# Patient Record
Sex: Female | Born: 1955 | Race: White | Hispanic: No | Smoking: Never smoker
Health system: Southern US, Community
[De-identification: ages and names within clinical notes are randomized; demographics above are authoritative.]

## PROBLEM LIST (undated history)

## (undated) DIAGNOSIS — C4491 Basal cell carcinoma of skin, unspecified: Secondary | ICD-10-CM

## (undated) DIAGNOSIS — M1990 Unspecified osteoarthritis, unspecified site: Secondary | ICD-10-CM

## (undated) DIAGNOSIS — T7840XA Allergy, unspecified, initial encounter: Secondary | ICD-10-CM

## (undated) DIAGNOSIS — I2510 Atherosclerotic heart disease of native coronary artery without angina pectoris: Secondary | ICD-10-CM

## (undated) DIAGNOSIS — R569 Unspecified convulsions: Secondary | ICD-10-CM

## (undated) DIAGNOSIS — E669 Obesity, unspecified: Secondary | ICD-10-CM

## (undated) DIAGNOSIS — I10 Essential (primary) hypertension: Secondary | ICD-10-CM

## (undated) DIAGNOSIS — E785 Hyperlipidemia, unspecified: Secondary | ICD-10-CM

## (undated) HISTORY — PX: COLONOSCOPY: SHX174

## (undated) HISTORY — DX: Hyperlipidemia, unspecified: E78.5

## (undated) HISTORY — PX: BREAST EXCISIONAL BIOPSY: SUR124

## (undated) HISTORY — DX: Basal cell carcinoma of skin, unspecified: C44.91

## (undated) HISTORY — PX: TUBAL LIGATION: SHX77

## (undated) HISTORY — PX: TONSILLECTOMY: SUR1361

## (undated) HISTORY — PX: BREAST CYST ASPIRATION: SHX578

## (undated) HISTORY — DX: Allergy, unspecified, initial encounter: T78.40XA

## (undated) HISTORY — DX: Unspecified osteoarthritis, unspecified site: M19.90

---

## 2007-11-23 ENCOUNTER — Ambulatory Visit: Payer: Self-pay | Admitting: Family Medicine

## 2008-04-01 ENCOUNTER — Ambulatory Visit: Payer: Self-pay | Admitting: Internal Medicine

## 2008-04-07 ENCOUNTER — Ambulatory Visit: Payer: Self-pay | Admitting: Family Medicine

## 2008-04-08 ENCOUNTER — Ambulatory Visit: Payer: Self-pay | Admitting: Family Medicine

## 2008-04-11 ENCOUNTER — Ambulatory Visit: Payer: Self-pay | Admitting: Family Medicine

## 2013-02-08 ENCOUNTER — Ambulatory Visit: Payer: Self-pay | Admitting: Unknown Physician Specialty

## 2016-03-31 ENCOUNTER — Encounter: Payer: Self-pay | Admitting: *Deleted

## 2016-04-01 ENCOUNTER — Ambulatory Visit: Payer: Managed Care, Other (non HMO) | Admitting: Anesthesiology

## 2016-04-01 ENCOUNTER — Ambulatory Visit
Admission: RE | Admit: 2016-04-01 | Discharge: 2016-04-01 | Disposition: A | Discharge status: Home / Self Care | Payer: Managed Care, Other (non HMO) | Source: Ambulatory Visit | Attending: Unknown Physician Specialty | Admitting: Unknown Physician Specialty

## 2016-04-01 ENCOUNTER — Encounter
Admission: RE | Disposition: A | Discharge status: Home / Self Care | Payer: Self-pay | Source: Ambulatory Visit | Attending: Unknown Physician Specialty

## 2016-04-01 DIAGNOSIS — Z7951 Long term (current) use of inhaled steroids: Secondary | ICD-10-CM | POA: Insufficient documentation

## 2016-04-01 DIAGNOSIS — Z86010 Personal history of colonic polyps: Secondary | ICD-10-CM | POA: Diagnosis not present

## 2016-04-01 DIAGNOSIS — E669 Obesity, unspecified: Secondary | ICD-10-CM | POA: Diagnosis not present

## 2016-04-01 DIAGNOSIS — Z6832 Body mass index (BMI) 32.0-32.9, adult: Secondary | ICD-10-CM | POA: Diagnosis not present

## 2016-04-01 DIAGNOSIS — I10 Essential (primary) hypertension: Secondary | ICD-10-CM | POA: Diagnosis not present

## 2016-04-01 DIAGNOSIS — Z79899 Other long term (current) drug therapy: Secondary | ICD-10-CM | POA: Insufficient documentation

## 2016-04-01 DIAGNOSIS — D123 Benign neoplasm of transverse colon: Secondary | ICD-10-CM | POA: Diagnosis not present

## 2016-04-01 DIAGNOSIS — Z1211 Encounter for screening for malignant neoplasm of colon: Secondary | ICD-10-CM | POA: Insufficient documentation

## 2016-04-01 HISTORY — DX: Essential (primary) hypertension: I10

## 2016-04-01 HISTORY — DX: Obesity, unspecified: E66.9

## 2016-04-01 HISTORY — PX: COLONOSCOPY WITH PROPOFOL: SHX5780

## 2016-04-01 SURGERY — COLONOSCOPY WITH PROPOFOL
Anesthesia: General

## 2016-04-01 MED ORDER — SODIUM CHLORIDE 0.9 % IV SOLN
INTRAVENOUS | Status: DC
  Administered 2016-04-01: 1000 mL via INTRAVENOUS

## 2016-04-01 MED ORDER — SODIUM CHLORIDE 0.9 % IV SOLN: INTRAVENOUS | Status: DC

## 2016-04-01 MED ORDER — PROPOFOL 500 MG/50ML IV EMUL
INTRAVENOUS | Status: DC | PRN
  Administered 2016-04-01: 140 ug/kg/min via INTRAVENOUS

## 2016-04-01 MED ORDER — FENTANYL CITRATE (PF) 100 MCG/2ML IJ SOLN
INTRAMUSCULAR | Status: DC | PRN
  Administered 2016-04-01: 50 ug via INTRAVENOUS

## 2016-04-01 MED ORDER — PROPOFOL 10 MG/ML IV BOLUS
INTRAVENOUS | Status: DC | PRN
  Administered 2016-04-01: 50 mg via INTRAVENOUS
  Administered 2016-04-01: 20 mg via INTRAVENOUS

## 2016-04-01 MED ORDER — MIDAZOLAM HCL 5 MG/5ML IJ SOLN
INTRAMUSCULAR | Status: DC | PRN
  Administered 2016-04-01: 1 mg via INTRAVENOUS

## 2016-04-01 NOTE — Transfer of Care (Signed)
Immediate Anesthesia Transfer of Care Note  Patient: Heather Dean  Procedure(s) Performed: Procedure(s): COLONOSCOPY WITH PROPOFOL (N/A)  Patient Location: PACU and Endoscopy Unit  Anesthesia Type:General  Level of Consciousness: awake, alert  and oriented  Airway & Oxygen Therapy: Patient Spontanous Breathing and Patient connected to nasal cannula oxygen  Post-op Assessment: Report given to RN and Post -op Vital signs reviewed and stable  Post vital signs: Reviewed and stable  Last Vitals:  Filed Vitals:   04/01/16 1239 04/01/16 1319  BP: 179/80   Pulse: 87   Temp: 36.1 C 36 C  Resp: 16     Last Pain: There were no vitals filed for this visit.       Complications: No apparent anesthesia complications

## 2016-04-01 NOTE — H&P (Signed)
   Primary Care Physician:  PROVIDER NOT [place] Primary Gastroenterologist:  Dr. Vira Agar  Pre-Procedure History & Physical: HPI:  Heather Dean is a 60 y.o. female is here for an colonoscopy.   Past Medical History  Diagnosis Date  . Hypertension   . Obesity     Past Surgical History  Procedure Laterality Date  . Tubal ligation    . Tonsillectomy    . Colonoscopy      Prior to Admission medications   Medication Sig Start Date End Date Taking? Authorizing Provider  Ascorbic Acid (VITAMIN C) 100 MG tablet Take 100 mg by mouth daily.   Yes Historical Provider, MD  calcium citrate-vitamin D (CITRACAL+D) 315-200 MG-UNIT tablet Take 1 tablet by mouth 2 (two) times daily.   Yes Historical Provider, MD  fluticasone (FLONASE) 50 MCG/ACT nasal spray Place into both nostrils daily.   Yes Historical Provider, MD    Allergies as of 03/14/2016  . (Not on File)    History reviewed. No pertinent family history.  Social History   Social History  . Marital Status: Divorced    Spouse Name: N/A  . Number of Children: N/A  . Years of Education: N/A   Occupational History  . Not on file.   Social History Main Topics  . Smoking status: Never Smoker   . Smokeless tobacco: Never Used  . Alcohol Use: Yes  . Drug Use: No  . Sexual Activity: Not on file   Other Topics Concern  . Not on file   Social History Narrative    Review of Systems: See HPI, otherwise negative ROS  Physical Exam: BP 179/80 mmHg  Pulse 87  Temp(Src) 97 F (36.1 C) (Tympanic)  Resp 16  Ht 5\' 3"  (1.6 m)  Wt 82.555 kg (182 lb)  BMI 32.25 kg/m2  SpO2 99% General:   Alert,  pleasant and cooperative in NAD Head:  Normocephalic and atraumatic. Neck:  Supple; no masses or thyromegaly. Lungs:  Clear throughout to auscultation.    Heart:  Regular rate and rhythm. Abdomen:  Soft, nontender and nondistended. Normal bowel sounds, without guarding, and without rebound.   Neurologic:  Alert and  oriented x4;   grossly normal neurologically.  Impression/Plan: Heather Dean is here for an colonoscopy to be performed for [facility] colon polyps  Risks, benefits, limitations, and alternatives regarding  colonoscopy have been reviewed with the patient.  Questions have been answered.  All parties agreeable.   Gaylyn Cheers, MD  04/01/2016, 12:53 PM

## 2016-04-01 NOTE — Anesthesia Preprocedure Evaluation (Signed)
Anesthesia Evaluation  Patient identified by MRN, date of birth, ID band Patient awake    Reviewed: Allergy & Precautions, NPO status , Patient's Chart, lab work & pertinent test results  History of Anesthesia Complications Negative for: history of anesthetic complications  Airway Mallampati: II       Dental   Pulmonary neg pulmonary ROS,           Cardiovascular hypertension,      Neuro/Psych negative neurological ROS     GI/Hepatic negative GI ROS, Neg liver ROS,   Endo/Other  negative endocrine ROS  Renal/GU negative Renal ROS     Musculoskeletal   Abdominal   Peds  Hematology negative hematology ROS (+)   Anesthesia Other Findings   Reproductive/Obstetrics                             Anesthesia Physical Anesthesia Plan  ASA: II  Anesthesia Plan: General   Post-op Pain Management:    Induction: Intravenous  Airway Management Planned: Nasal Cannula  Additional Equipment:   Intra-op Plan:   Post-operative Plan:   Informed Consent: I have reviewed the patients History and Physical, chart, labs and discussed the procedure including the risks, benefits and alternatives for the proposed anesthesia with the patient or authorized representative who has indicated his/her understanding and acceptance.     Plan Discussed with:   Anesthesia Plan Comments:         Anesthesia Quick Evaluation

## 2016-04-01 NOTE — Op Note (Signed)
[facility]astroenterology Patient Name: Heather Dean Procedure Date: 04/01/2016 12:56 PM MRN: [id number] Account #: [id number] Date of Birth: Nov 17, 1955 Admit Type: Outpatient Age: 60 Room: [facility] ENDO ROOM 1 Gender: Female Note Status: Finalized Procedure:            Colonoscopy Indications:          High risk colon cancer surveillance: Personal history                        of colonic polyps Providers:            Manya Silvas, MD Referring MD:         No Local Md, MD (Referring MD) Medicines:            Propofol per Anesthesia Complications:        No immediate complications. Procedure:            Pre-Anesthesia Assessment:                       - After reviewing the risks and benefits, the patient                        was deemed in satisfactory condition to undergo the                        procedure.                       - Using IV propofol under the supervision of a CRNA was                        determined to be medically necessary for this procedure                        based on review of the patient's medical history,                        medications, and prior anesthesia history.                       - After reviewing the risks and benefits, the patient                        was deemed in satisfactory condition to undergo the                        procedure.                       After obtaining informed consent, the colonoscope was                        passed under direct vision. Throughout the procedure,                        the patient's blood pressure, pulse, and oxygen                        saturations were monitored continuously. The  Colonoscope was introduced through the anus and                        advanced to the the cecum, identified by appendiceal                        orifice and ileocecal valve. The colonoscopy was                        performed without difficulty. The patient tolerated the                         procedure well. The quality of the bowel preparation                        was excellent. Findings:      A diminutive polyp was found in the hepatic flexure. The polyp was       sessile. The polyp was removed with a jumbo cold forceps. Resection and       retrieval were complete.      The exam was otherwise without abnormality. Impression:           - One diminutive polyp at the hepatic flexure, removed                        with a jumbo cold forceps. Resected and retrieved.                       - The examination was otherwise normal. Recommendation:       - Await pathology results. Probably repeat in 5 years. Manya Silvas, MD 04/01/2016 1:20:01 PM This report has been signed electronically. Number of Addenda: 0 Note Initiated On: 04/01/2016 12:56 PM Scope Withdrawal Time: 0 hours 9 minutes 26 seconds  Total Procedure Duration: 0 hours 13 minutes 53 seconds       [facility]

## 2016-04-01 NOTE — Anesthesia Postprocedure Evaluation (Signed)
Anesthesia Post Note  Patient: Heather Dean  Procedure(s) Performed: Procedure(s) (LRB): COLONOSCOPY WITH PROPOFOL (N/A)  Patient location during evaluation: Endoscopy Anesthesia Type: General Level of consciousness: awake and alert Pain management: pain level controlled Vital Signs Assessment: post-procedure vital signs reviewed and stable Respiratory status: spontaneous breathing and respiratory function stable Cardiovascular status: stable Anesthetic complications: no    Last Vitals:  Filed Vitals:   04/01/16 1337 04/01/16 1347  BP: 138/79 138/73  Pulse: 73 72  Temp:    Resp: 16 19    Last Pain: There were no vitals filed for this visit.               Lorra Freeman K

## 2016-04-04 ENCOUNTER — Encounter: Payer: Self-pay | Admitting: Unknown Physician Specialty

## 2016-04-04 LAB — SURGICAL PATHOLOGY

## 2021-08-18 ENCOUNTER — Ambulatory Visit (INDEPENDENT_AMBULATORY_CARE_PROVIDER_SITE_OTHER): Payer: Medicare HMO | Admitting: Internal Medicine

## 2021-08-18 ENCOUNTER — Other Ambulatory Visit: Payer: Self-pay

## 2021-08-18 ENCOUNTER — Encounter: Payer: Self-pay | Admitting: Internal Medicine

## 2021-08-18 VITALS — BP 128/86 | HR 93 | Ht 63.0 in | Wt 170.0 lb

## 2021-08-18 DIAGNOSIS — R030 Elevated blood-pressure reading, without diagnosis of hypertension: Secondary | ICD-10-CM | POA: Diagnosis not present

## 2021-08-18 DIAGNOSIS — R058 Other specified cough: Secondary | ICD-10-CM | POA: Insufficient documentation

## 2021-08-18 DIAGNOSIS — Z1231 Encounter for screening mammogram for malignant neoplasm of breast: Secondary | ICD-10-CM | POA: Diagnosis not present

## 2021-08-18 DIAGNOSIS — I10 Essential (primary) hypertension: Secondary | ICD-10-CM | POA: Insufficient documentation

## 2021-08-18 DIAGNOSIS — Z23 Encounter for immunization: Secondary | ICD-10-CM | POA: Diagnosis not present

## 2021-08-18 MED ORDER — FLUTICASONE PROPIONATE 50 MCG/ACT NA SUSP: 2.0000 | Freq: Every day | NASAL | 5 refills | Status: DC

## 2021-08-18 MED ORDER — LORATADINE 10 MG PO TABS
10.0000 mg | ORAL_TABLET | Freq: Every day | ORAL | 1 refills | Status: DC
Start: 2021-08-18 — End: 2022-01-28

## 2021-08-18 NOTE — Progress Notes (Signed)
Date:  08/18/2021   Name:  Heather Dean   DOB:  [birth year]   MRN:  [id number]   Chief Complaint: New Patient (Initial Visit) and Cough  Cough This is a recurrent problem. The current episode started more than 1 month ago. The problem occurs hourly. The cough is Productive of sputum. Associated symptoms include postnasal drip. Pertinent negatives include no chest pain, ear pain, fever, headaches, sore throat or shortness of breath. The symptoms are aggravated by lying down, pollens and cold air. Treatments tried: flonase and claritin intermittently.  Hypertension Chronicity: borderline elevated. The problem has been waxing and waning since onset. Pertinent negatives include no chest pain, headaches, peripheral edema or shortness of breath. Past treatments include lifestyle changes (sodium reduction). There is no history of kidney disease, CAD/MI or CVA.   No results found for: CREATININE, BUN, NA, K, CL, CO2 No results found for: CHOL, HDL, LDLCALC, LDLDIRECT, TRIG, CHOLHDL No results found for: TSH No results found for: HGBA1C No results found for: WBC, HGB, HCT, MCV, PLT No results found for: ALT, AST, GGT, ALKPHOS, BILITOT   Review of Systems  Constitutional:  Negative for fever.  HENT:  Positive for postnasal drip. Negative for ear pain and sore throat.   Respiratory:  Positive for cough. Negative for shortness of breath.   Cardiovascular:  Negative for chest pain.  Neurological:  Negative for headaches.  Psychiatric/Behavioral:  Negative for dysphoric mood and sleep disturbance. The patient is not nervous/anxious.    There are no problems to display for this patient.   No Known Allergies  Past Surgical History:  Procedure Laterality Date   COLONOSCOPY     COLONOSCOPY WITH PROPOFOL N/A 04/01/2016   Procedure: COLONOSCOPY WITH PROPOFOL;  Surgeon: Manya Silvas, MD;  Location: [facility] ENDOSCOPY;  Service: Endoscopy;  Laterality: N/A;   TONSILLECTOMY     TUBAL LIGATION       Social History   Tobacco Use   Smoking status: Never   Smokeless tobacco: Never  Vaping Use   Vaping Use: Never used  Substance Use Topics   Alcohol use: Yes   Drug use: No     Medication list has been reviewed and updated.  Current Meds  Medication Sig   Ascorbic Acid (VITAMIN C) 100 MG tablet Take 100 mg by mouth daily.   calcium citrate-vitamin D (CITRACAL+D) 315-200 MG-UNIT tablet Take 1 tablet by mouth 2 (two) times daily.   loratadine (CLARITIN) 10 MG tablet Take 1 tablet (10 mg total) by mouth daily.   [DISCONTINUED] fluticasone (FLONASE) 50 MCG/ACT nasal spray Place into both nostrils daily.    PHQ 2/9 Scores 08/18/2021  PHQ - 2 Score 0  PHQ- 9 Score 1    GAD 7 : Generalized Anxiety Score 08/18/2021  Nervous, Anxious, on Edge 0  Control/stop worrying 0  Worry too much - different things 0  Trouble relaxing 0  Restless 0  Easily annoyed or irritable 0  Afraid - awful might happen 0  Total GAD 7 Score 0  Anxiety Difficulty Not difficult at all    BP Readings from Last 3 Encounters:  08/18/21 128/86  04/01/16 138/73    Physical Exam Vitals and nursing note reviewed.  Constitutional:      General: She is not in acute distress.    Appearance: Normal appearance. She is well-developed.  HENT:     Head: Normocephalic and atraumatic.     Right Ear: Tympanic membrane and ear canal normal.  Left Ear: Tympanic membrane and ear canal normal.     Nose:     Right Sinus: No maxillary sinus tenderness or frontal sinus tenderness.     Left Sinus: No maxillary sinus tenderness or frontal sinus tenderness.  Cardiovascular:     Rate and Rhythm: Normal rate and regular rhythm.     Pulses: Normal pulses.  Pulmonary:     Effort: Pulmonary effort is normal. No respiratory distress.     Breath sounds: No wheezing or rhonchi.  Musculoskeletal:        General: Normal range of motion.     Cervical back: Normal range of motion.     Right lower leg: No edema.      Left lower leg: No edema.  Lymphadenopathy:     Cervical: No cervical adenopathy.  Skin:    General: Skin is warm and dry.     Capillary Refill: Capillary refill takes less than 2 seconds.     Findings: No rash.  Neurological:     General: No focal deficit present.     Mental Status: She is alert and oriented to person, place, and time.  Psychiatric:        Mood and Affect: Mood normal.        Behavior: Behavior normal.    Wt Readings from Last 3 Encounters:  08/18/21 170 lb (77.1 kg)  04/01/16 182 lb (82.6 kg)    BP 128/86   Pulse 93   Ht 5\' 3"  (1.6 m)   Wt 170 lb (77.1 kg)   SpO2 97%   BMI 30.11 kg/m   Assessment and Plan: 1. Allergic cough No hx of tobacco use or exposure in the workplace. Recommend 3-4 weeks of regular use of flonase and claritin - fluticasone (FLONASE) 50 MCG/ACT nasal spray; Place 2 sprays into both nostrils daily.  Dispense: 16 g; Refill: 5 - loratadine (CLARITIN) 10 MG tablet; Take 1 tablet (10 mg total) by mouth daily.  Dispense: 90 tablet; Refill: 1  2. Elevated BP without diagnosis of hypertension Continue sodium restriction, more exercise Monitor BP and bring cuff to next appt  3. Encounter for screening mammogram for breast cancer Schedule at [facility] - MM 3D SCREEN BREAST BILATERAL  4. Need for immunization against influenza - Flu Vaccine QUAD High Dose(Fluad)   Partially dictated using Editor, commissioning. Any errors are unintentional.  Halina Maidens, MD [facility]  08/18/2021

## 2021-09-21 ENCOUNTER — Telehealth: Payer: Self-pay

## 2021-09-21 NOTE — Telephone Encounter (Signed)
Called pt as a reminder to completed mammogram. Pt verbalized understanding.  KP

## 2021-12-16 ENCOUNTER — Encounter: Admitting: Internal Medicine

## 2021-12-23 ENCOUNTER — Ambulatory Visit
Admission: RE | Admit: 2021-12-23 | Discharge: 2021-12-23 | Disposition: A | Discharge status: Home / Self Care | Payer: Medicare HMO | Source: Ambulatory Visit | Attending: Internal Medicine | Admitting: Internal Medicine

## 2021-12-23 ENCOUNTER — Other Ambulatory Visit: Payer: Self-pay

## 2021-12-23 DIAGNOSIS — Z1231 Encounter for screening mammogram for malignant neoplasm of breast: Secondary | ICD-10-CM | POA: Insufficient documentation

## 2021-12-24 ENCOUNTER — Other Ambulatory Visit: Payer: Self-pay | Admitting: Internal Medicine

## 2021-12-24 DIAGNOSIS — R928 Other abnormal and inconclusive findings on diagnostic imaging of breast: Secondary | ICD-10-CM

## 2021-12-24 DIAGNOSIS — R921 Mammographic calcification found on diagnostic imaging of breast: Secondary | ICD-10-CM

## 2022-01-05 ENCOUNTER — Ambulatory Visit (INDEPENDENT_AMBULATORY_CARE_PROVIDER_SITE_OTHER): Payer: Medicare HMO

## 2022-01-05 DIAGNOSIS — Z0000 Encounter for general adult medical examination without abnormal findings: Secondary | ICD-10-CM

## 2022-01-05 DIAGNOSIS — Z780 Asymptomatic menopausal state: Secondary | ICD-10-CM | POA: Diagnosis not present

## 2022-01-05 DIAGNOSIS — Z1211 Encounter for screening for malignant neoplasm of colon: Secondary | ICD-10-CM | POA: Diagnosis not present

## 2022-01-05 NOTE — Progress Notes (Signed)
Subjective:   Heather Dean is a 66 y.o. female who presents for an Initial Medicare Annual Wellness Visit.  Virtual Visit via Telephone Note  I connected with  Heather Dean on 01/05/22 at 10:30 AM EDT by telephone and verified that I am speaking with the correct person using two identifiers.  Location: Patient: home Provider: [facility] Persons participating in the virtual visit: [place]   I discussed the limitations, risks, security and privacy concerns of performing an evaluation and management service by telephone and the availability of in person appointments. The patient expressed understanding and agreed to proceed.  Interactive audio and video telecommunications were attempted between this nurse and patient, however failed, due to patient having technical difficulties OR patient did not have access to video capability.  We continued and completed visit with audio only.  Some vital signs may be absent or patient reported.   Heather Marker, LPN   Review of Systems     Cardiac Risk Factors include: advanced age (>53mn, >>5women)     Objective:    There were no vitals filed for this visit. There is no height or weight on file to calculate BMI.     01/05/2022   10:51 AM  Advanced Directives  Does Patient Have a Medical Advance Directive? Yes  Type of AParamedicof A[place]worth[place] will  Copy of [place]? No - copy requested    Current Medications (verified) Outpatient Encounter Medications as of 01/05/2022  Medication Sig   fluticasone (FLONASE) 50 MCG/ACT nasal spray Place 2 sprays into both nostrils daily.   loratadine (CLARITIN) 10 MG tablet Take 1 tablet (10 mg total) by mouth daily.   Multiple Vitamin (MULTIVITAMIN ADULT PO) Take by mouth.   [DISCONTINUED] Ascorbic Acid (VITAMIN C) 100 MG tablet Take 100 mg by mouth daily.   [DISCONTINUED] calcium citrate-vitamin D (CITRACAL+D) 315-200 MG-UNIT  tablet Take 1 tablet by mouth 2 (two) times daily.   No facility-administered encounter medications on file as of 01/05/2022.    Allergies (verified) Patient has no known allergies.   History: Past Medical History:  Diagnosis Date   Basal cell carcinoma    Hyperlipidemia    Hypertension    Obesity    Past Surgical History:  Procedure Laterality Date   BREAST CYST ASPIRATION Left    BREAST EXCISIONAL BIOPSY Right    COLONOSCOPY     COLONOSCOPY WITH PROPOFOL N/A 04/01/2016   Procedure: COLONOSCOPY WITH PROPOFOL;  Surgeon: RManya Silvas MD;  Location: [facility]  Service: Endoscopy;  Laterality: N/A;   TONSILLECTOMY     TUBAL LIGATION     Family History  Problem Relation Age of Onset   Hypertension Mother    Heart disease Father    Melanoma Maternal Aunt    Breast cancer Paternal Aunt    Hypertension Maternal Grandmother    Hypertension Maternal Grandfather    Cancer Maternal Grandfather    Hypertension Brother    Social History   Socioeconomic History   Marital status: Divorced    Spouse name: Not on file   Number of children: 0   Years of education: Not on file   Highest education level: Not on file  Occupational History   Not on file  Tobacco Use   Smoking status: Never   Smokeless tobacco: Never  Vaping Use   Vaping Use: Never used  Substance and Sexual Activity   Alcohol use: Not Currently  Drug use: No   Sexual activity: Not Currently    Birth control/protection: None  Other Topics Concern   Not on file  Social History Narrative   Pt lives alone   Social Determinants of Health   Financial Resource Strain: Low Risk    Difficulty of Paying Living Expenses: Not hard at all  Food Insecurity: No Food Insecurity   Worried About Charity fundraiser in the Last Year: Never true   Ran Out of Food in the Last Year: Never true  Transportation Needs: No Transportation Needs   Lack of Transportation (Medical): No   Lack of Transportation  (Non-Medical): No  Physical Activity: Inactive   Days of Exercise per Week: 0 days   Minutes of Exercise per Session: 0 min  Stress: No Stress Concern Present   Feeling of Stress : Only a little  Social Connections: Moderately Isolated   Frequency of Communication with Friends and Family: More than three times a week   Frequency of Social Gatherings with Friends and Family: More than three times a week   Attends Religious Services: More than 4 times per year   Active Member of [employer] or Organizations: No   Attends Music therapist: Never   Marital Status: Divorced    Tobacco Counseling Counseling given: Not Answered   Clinical Intake:  Pre-visit preparation completed: Yes  Pain : No/denies pain     Nutritional Risks: None Diabetes: No  How often do you need to have someone help you when you read instructions, pamphlets, or other written materials from your doctor or pharmacy?: 1 - Never    Interpreter Needed?: No  Information entered by :: Heather Marker LPN   Activities of Daily Living    01/05/2022   10:53 AM 08/18/2021    2:58 PM  In your present state of health, do you have any difficulty performing the following activities:  Hearing? 0 0  Vision? 0 0  Difficulty concentrating or making decisions? 0 0  Walking or climbing stairs? 0 0  Dressing or bathing? 0 0  Doing errands, shopping? 0 0  Preparing Food and eating ? N   Using the Toilet? N   In the past six months, have you accidently leaked urine? Y   Comment wears pads for protection   Do you have problems with loss of bowel control? N   Managing your Medications? N   Managing your Finances? N   Housekeeping or managing your Housekeeping? N     Patient Care Team: Heather Hess, MD as PCP - General (Internal Medicine)  Indicate any recent Medical Services you may have received from other than [facility] providers in the past year (date may be approximate).     Assessment:   This is a  routine wellness examination for Heather Dean.  Hearing/Vision screen Hearing Screening - Comments:: Pt denies hearing difficulty Vision Screening - Comments:: Past due for eye exam; established in [place]  Dietary issues and exercise activities discussed: Current Exercise Habits: The patient does not participate in regular exercise at present, Exercise limited by: None identified   Goals Addressed             This Visit's Progress    Increase physical activity       Recommend increasing physical activity to at least 3 days per week        Depression Screen    01/05/2022   10:50 AM 08/18/2021    2:57 PM  PHQ 2/9 Scores  PHQ - 2 Score 0 0  PHQ- 9 Score  1    Fall Risk    01/05/2022   10:53 AM 08/18/2021    2:58 PM  Fall Risk   Falls in the past year? 0 0  Number falls in past yr: 0 0  Injury with Fall? 0 0  Risk for fall due to : No Fall Risks No Fall Risks  Follow up Falls prevention discussed Falls evaluation completed    FALL RISK PREVENTION PERTAINING TO THE HOME:  Any stairs in or around the home? Yes  If so, are there any without handrails? No  Home free of loose throw rugs in walkways, pet beds, electrical cords, etc? Yes  Adequate lighting in your home to reduce risk of falls? Yes   ASSISTIVE DEVICES UTILIZED TO PREVENT FALLS:  Life alert? No  Use of a cane, walker or w/c? No  Grab bars in the bathroom? No  Shower chair or bench in shower? No  Elevated toilet seat or a handicapped toilet? Yes   TIMED UP AND GO:  Was the test performed? No . Telephonic visit.   Cognitive Function: Normal cognitive status assessed by direct observation by this Nurse Health Advisor. No abnormalities found.          Immunizations Immunization History  Administered Date(s) Administered   Fluad Quad(high Dose 65+) 08/18/2021   PFIZER(Purple Top)SARS-COV-2 Vaccination 11/30/2019, 01/02/2020    TDAP status: Due, Education has been provided regarding the importance  of this vaccine. Advised may receive this vaccine at local pharmacy or Health Dept. Aware to provide a copy of the vaccination record if obtained from local pharmacy or Health Dept. Verbalized acceptance and understanding.  Flu Vaccine status: Up to date  Pneumococcal vaccine status: Due, Education has been provided regarding the importance of this vaccine. Advised may receive this vaccine at local pharmacy or Health Dept. Aware to provide a copy of the vaccination record if obtained from local pharmacy or Health Dept. Verbalized acceptance and understanding.  Covid-19 vaccine status: Completed vaccines  Qualifies for Shingles Vaccine? Yes   Zostavax completed Yes   per patient Shingrix Completed?: No.    Education has been provided regarding the importance of this vaccine. Patient has been advised to call insurance company to determine out of pocket expense if they have not yet received this vaccine. Advised may also receive vaccine at local pharmacy or Health Dept. Verbalized acceptance and understanding.  Screening Tests Health Maintenance  Topic Date Due   Hepatitis C Screening  Never done   TETANUS/TDAP  Never done   Zoster Vaccines- Shingrix (1 of 2) Never done   Pneumonia Vaccine 3+ Years old (1 - PCV) Never done   DEXA SCAN  Never done   COVID-19 Vaccine (3 - Booster for Pfizer series) 02/27/2020   COLONOSCOPY (Pts 45-47yr Insurance coverage will need to be confirmed)  04/01/2021   MAMMOGRAM  12/24/2023   INFLUENZA VACCINE  Completed   HPV VACCINES  Aged Out    Health Maintenance  Health Maintenance Due  Topic Date Due   Hepatitis C Screening  Never done   TETANUS/TDAP  Never done   Zoster Vaccines- Shingrix (1 of 2) Never done   Pneumonia Vaccine 68 Years old (1 - PCV) Never done   DEXA SCAN  Never done   COVID-19 Vaccine (3 - Booster for PUlyssesseries) 02/27/2020   COLONOSCOPY (Pts 45-476yrInsurance coverage will need to be confirmed)  04/01/2021    Colorectal  cancer screening: Type of screening: Colonoscopy. Completed 04/01/16. Repeat every 5 years. Referral sent to [facility] today.   Mammogram status: Completed 12/23/21. Repeat every year. Scheduled for follow up 01/12/22  Bone Density status: Ordered today. Pt provided with contact info and advised to call to schedule appt.  Lung Cancer Screening: (Low Dose CT Chest recommended if Age 59-80 years, 30 pack-year currently smoking OR have quit w/in 15years.) does not qualify.   Additional Screening:  Hepatitis C Screening: does qualify; due  Vision Screening: Recommended annual ophthalmology exams for early detection of glaucoma and other disorders of the eye. Is the patient up to date with their annual eye exam?  No  Who is the provider or what is the name of the office in which the patient attends annual eye exams? Eye dr in [place]: Recommended annual dental exams for proper oral hygiene  Community Resource Referral / Chronic Care Management: CRR required this visit?  No   CCM required this visit?  No      Plan:     I have personally reviewed and noted the following in the patient's chart:   Medical and social history Use of alcohol, tobacco or illicit drugs  Current medications and supplements including opioid prescriptions. Patient is not currently taking opioid prescriptions. Functional ability and status Nutritional status Physical activity Advanced directives List of other physicians Hospitalizations, surgeries, and ER visits in previous 12 months Vitals Screenings to include cognitive, depression, and falls Referrals and appointments  In addition, I have reviewed and discussed with patient certain preventive protocols, quality metrics, and best practice recommendations. A written personalized care plan for preventive services as well as general preventive health recommendations were provided to patient.     Heather Marker,  LPN   7/74/1287   Nurse Notes: pt states she has had an ongoing cough with occasional productive phlegm due to allergies. Pt taking claritin and flonase and alternating with allegra. Pt advised to contact office if sxs worsen or persist.

## 2022-01-05 NOTE — Patient Instructions (Signed)
Heather Dean , Thank you for taking time to come for your Medicare Wellness Visit. I appreciate your ongoing commitment to your health goals. Please review the following plan we discussed and let me know if I can assist you in the future.   Screening recommendations/referrals: Colonoscopy: done 04/01/16. Referral sent to [facility] today for repeat screening colonoscopy. They will contact you for an appointment.  Mammogram: done 12/23/21. Follow up scheduled 01/12/22.  Bone Density: Please call [phone] to schedule your bone density screening.  Recommended yearly ophthalmology/optometry visit for glaucoma screening and checkup Recommended yearly dental visit for hygiene and checkup  Vaccinations: Influenza vaccine: done 08/18/21 Pneumococcal vaccine: due Tdap vaccine: due Shingles vaccine: Shingrix discussed. Please contact your pharmacy for coverage information.  Covid-19: done 11/30/19 & 01/02/20  Advanced directives: Please bring a copy of your health care power of attorney and living will to the office at your convenience.   Conditions/risks identified: Recommend increasing physical activity to at least 3 days per week   Next appointment: Follow up in one year for your annual wellness visit    Preventive Care 65 Years and Older, Female Preventive care refers to lifestyle choices and visits with your health care provider that can promote health and wellness. What does preventive care include? A yearly physical exam. This is also called an annual well check. Dental exams once or twice a year. Routine eye exams. Ask your health care provider how often you should have your eyes checked. Personal lifestyle choices, including: Daily care of your teeth and gums. Regular physical activity. Eating a healthy diet. Avoiding tobacco and drug use. Limiting alcohol use. Practicing safe sex. Taking low-dose aspirin every day. Taking vitamin and mineral supplements as recommended  by your health care provider. What happens during an annual well check? The services and screenings done by your health care provider during your annual well check will depend on your age, overall health, lifestyle risk factors, and family history of disease. Counseling  Your health care provider may ask you questions about your: Alcohol use. Tobacco use. Drug use. Emotional well-being. Home and relationship well-being. Sexual activity. Eating habits. History of falls. Memory and ability to understand (cognition). Work and work Statistician. Reproductive health. Screening  You may have the following tests or measurements: Height, weight, and BMI. Blood pressure. Lipid and cholesterol levels. These may be checked every 5 years, or more frequently if you are over 47 years old. Skin check. Lung cancer screening. You may have this screening every year starting at age 48 if you have a 30-pack-year history of smoking and currently smoke or have quit within the past 15 years. Fecal occult blood test (FOBT) of the stool. You may have this test every year starting at age 3. Flexible sigmoidoscopy or colonoscopy. You may have a sigmoidoscopy every 5 years or a colonoscopy every 10 years starting at age 64. Hepatitis C blood test. Hepatitis B blood test. Sexually transmitted disease (STD) testing. Diabetes screening. This is done by checking your blood sugar (glucose) after you have not eaten for a while (fasting). You may have this done every 1-3 years. Bone density scan. This is done to screen for osteoporosis. You may have this done starting at age 20. Mammogram. This may be done every 1-2 years. Talk to your health care provider about how often you should have regular mammograms. Talk with your health care provider about your test results, treatment options, and if necessary, the need for more tests. Vaccines  Your  health care provider may recommend certain vaccines, such as: Influenza  vaccine. This is recommended every year. Tetanus, diphtheria, and acellular pertussis (Tdap, Td) vaccine. You may need a Td booster every 10 years. Zoster vaccine. You may need this after age 60. Pneumococcal 13-valent conjugate (PCV13) vaccine. One dose is recommended after age 21. Pneumococcal polysaccharide (PPSV23) vaccine. One dose is recommended after age 45. Talk to your health care provider about which screenings and vaccines you need and how often you need them. This information is not intended to replace advice given to you by your health care provider. Make sure you discuss any questions you have with your health care provider. Document Released: 10/23/2015 Document Revised: 06/15/2016 Document Reviewed: 07/28/2015 Elsevier Interactive Patient Education  2017 [place] Prevention in the Home Falls can cause injuries. They can happen to people of all ages. There are many things you can do to make your home safe and to help prevent falls. What can I do on the outside of my home? Regularly fix the edges of walkways and driveways and fix any cracks. Remove anything that might make you trip as you walk through a door, such as a raised step or threshold. Trim any bushes or trees on the path to your home. Use bright outdoor lighting. Clear any walking paths of anything that might make someone trip, such as rocks or tools. Regularly check to see if handrails are loose or broken. Make sure that both sides of any steps have handrails. Any raised decks and porches should have guardrails on the edges. Have any leaves, snow, or ice cleared regularly. Use sand or salt on walking paths during winter. Clean up any spills in your garage right away. This includes oil or grease spills. What can I do in the bathroom? Use night lights. Install grab bars by the toilet and in the tub and shower. Do not use towel bars as grab bars. Use non-skid mats or decals in the tub or shower. If you  need to sit down in the shower, use a plastic, non-slip stool. Keep the floor dry. Clean up any water that spills on the floor as soon as it happens. Remove soap buildup in the tub or shower regularly. Attach bath mats securely with double-sided non-slip rug tape. Do not have throw rugs and other things on the floor that can make you trip. What can I do in the bedroom? Use night lights. Make sure that you have a light by your bed that is easy to reach. Do not use any sheets or blankets that are too big for your bed. They should not hang down onto the floor. Have a firm chair that has side arms. You can use this for support while you get dressed. Do not have throw rugs and other things on the floor that can make you trip. What can I do in the kitchen? Clean up any spills right away. Avoid walking on wet floors. Keep items that you use a lot in easy-to-reach places. If you need to reach something above you, use a strong step stool that has a grab bar. Keep electrical cords out of the way. Do not use floor polish or wax that makes floors slippery. If you must use wax, use non-skid floor wax. Do not have throw rugs and other things on the floor that can make you trip. What can I do with my stairs? Do not leave any items on the stairs. Make sure that there are handrails  on both sides of the stairs and use them. Fix handrails that are broken or loose. Make sure that handrails are as long as the stairways. Check any carpeting to make sure that it is firmly attached to the stairs. Fix any carpet that is loose or worn. Avoid having throw rugs at the top or bottom of the stairs. If you do have throw rugs, attach them to the floor with carpet tape. Make sure that you have a light switch at the top of the stairs and the bottom of the stairs. If you do not have them, ask someone to add them for you. What else can I do to help prevent falls? Wear shoes that: Do not have high heels. Have rubber  bottoms. Are comfortable and fit you well. Are closed at the toe. Do not wear sandals. If you use a stepladder: Make sure that it is fully opened. Do not climb a closed stepladder. Make sure that both sides of the stepladder are locked into place. Ask someone to hold it for you, if possible. Clearly mark and make sure that you can see: Any grab bars or handrails. First and last steps. Where the edge of each step is. Use tools that help you move around (mobility aids) if they are needed. These include: Canes. Walkers. Scooters. Crutches. Turn on the lights when you go into a dark area. Replace any light bulbs as soon as they burn out. Set up your furniture so you have a clear path. Avoid moving your furniture around. If any of your floors are uneven, fix them. If there are any pets around you, be aware of where they are. Review your medicines with your doctor. Some medicines can make you feel dizzy. This can increase your chance of falling. Ask your doctor what other things that you can do to help prevent falls. This information is not intended to replace advice given to you by your health care provider. Make sure you discuss any questions you have with your health care provider. Document Released: 07/23/2009 Document Revised: 03/03/2016 Document Reviewed: 10/31/2014 Elsevier Interactive Patient Education  2017 [employer].

## 2022-01-06 ENCOUNTER — Telehealth: Payer: Self-pay

## 2022-01-06 NOTE — Telephone Encounter (Signed)
CALLED PATIENT NO ANSWER LEFT VOICEMAIL FOR A CALL BACK ? ?

## 2022-01-07 ENCOUNTER — Telehealth: Payer: Self-pay

## 2022-01-07 NOTE — Telephone Encounter (Signed)
CALLED PATIENT NO ANSWER LEFT VOICEMAIL FOR A CALL BACK ? ?

## 2022-01-10 ENCOUNTER — Other Ambulatory Visit: Payer: Self-pay

## 2022-01-10 DIAGNOSIS — Z86010 Personal history of colonic polyps: Secondary | ICD-10-CM

## 2022-01-10 MED ORDER — NA SULFATE-K SULFATE-MG SULF 17.5-3.13-1.6 GM/177ML PO SOLN: 1.0000 | Freq: Once | ORAL | 0 refills | Status: AC

## 2022-01-10 NOTE — Progress Notes (Signed)
Gastroenterology Pre-Procedure Review ? ?Request Date: 01/20/2022 ?Requesting Physician: Dr. Vicente Males ? ?PATIENT REVIEW QUESTIONS: The patient responded to the following health history questions as indicated:   ? ?1. Are you having any GI issues? no ?2. Do you have a personal history of Polyps? yes (last colonoscopy) ?3. Do you have a family history of Colon Cancer or Polyps? yes (polyps) ?4. Diabetes Mellitus? no ?5. Joint replacements in the past 12 months?no ?6. Major health problems in the past 3 months?no ?7. Any artificial heart valves, MVP, or defibrillator?no ?   ?MEDICATIONS & ALLERGIES:    ?Patient reports the following regarding taking any anticoagulation/antiplatelet therapy:   ?Plavix, Coumadin, Eliquis, Xarelto, Lovenox, Pradaxa, Brilinta, or Effient? no ?Aspirin? no ? ?Patient confirms/reports the following medications:  ?Current Outpatient Medications  ?Medication Sig Dispense Refill  ? fluticasone (FLONASE) 50 MCG/ACT nasal spray Place 2 sprays into both nostrils daily. 16 g 5  ? loratadine (CLARITIN) 10 MG tablet Take 1 tablet (10 mg total) by mouth daily. 90 tablet 1  ? Multiple Vitamin (MULTIVITAMIN ADULT PO) Take by mouth.    ? ?No current facility-administered medications for this visit.  ? ? ?Patient confirms/reports the following allergies:  ?No Known Allergies ? ?No orders of the defined types were placed in this encounter. ? ? ?AUTHORIZATION INFORMATION ?Primary Insurance: ?1D#: ?Group #: ? ?Secondary Insurance: ?1D#: ?Group #: ? ?SCHEDULE INFORMATION: ?Date: 01/20/2022 ?Time: ?Location:[facility] ? ?

## 2022-01-12 ENCOUNTER — Ambulatory Visit
Admission: RE | Admit: 2022-01-12 | Discharge: 2022-01-12 | Disposition: A | Discharge status: Home / Self Care | Payer: Medicare HMO | Source: Ambulatory Visit | Attending: Internal Medicine | Admitting: Internal Medicine

## 2022-01-12 DIAGNOSIS — R921 Mammographic calcification found on diagnostic imaging of breast: Secondary | ICD-10-CM | POA: Insufficient documentation

## 2022-01-12 DIAGNOSIS — R928 Other abnormal and inconclusive findings on diagnostic imaging of breast: Secondary | ICD-10-CM | POA: Diagnosis present

## 2022-01-18 ENCOUNTER — Telehealth: Payer: Self-pay | Admitting: Gastroenterology

## 2022-01-18 ENCOUNTER — Telehealth: Payer: Self-pay

## 2022-01-18 NOTE — Telephone Encounter (Signed)
CALLED PATIENT NO ANSWER LEFT VOICEMAIL FOR A CALL BACK ? ?

## 2022-01-18 NOTE — Telephone Encounter (Signed)
Patient left vm to cancel colonoscopy. Requesting a call back as well. ?

## 2022-01-18 NOTE — Telephone Encounter (Signed)
Colonoscopy canceled per patient request due to unable to purchase prep.  Offered to send in a cheaper prep but she said she has to go out of town, and has tags an taxes due.  She will call back to reschedule. ? ?Thanks, ?Sharyn Lull, CMA ?

## 2022-01-20 ENCOUNTER — Ambulatory Visit: Admission: RE | Admit: 2022-01-20 | Payer: Medicare HMO | Source: Ambulatory Visit | Admitting: Gastroenterology

## 2022-01-20 ENCOUNTER — Encounter: Admission: RE | Payer: Self-pay | Source: Ambulatory Visit

## 2022-01-20 SURGERY — COLONOSCOPY WITH PROPOFOL
Anesthesia: General

## 2022-01-28 ENCOUNTER — Ambulatory Visit
Admission: RE | Admit: 2022-01-28 | Discharge: 2022-01-28 | Disposition: A | Discharge status: Home / Self Care | Payer: Medicare HMO | Attending: Internal Medicine | Admitting: Internal Medicine

## 2022-01-28 ENCOUNTER — Ambulatory Visit
Admission: RE | Admit: 2022-01-28 | Discharge: 2022-01-28 | Disposition: A | Discharge status: Home / Self Care | Payer: Medicare HMO | Source: Ambulatory Visit | Attending: Internal Medicine | Admitting: Internal Medicine

## 2022-01-28 ENCOUNTER — Ambulatory Visit (INDEPENDENT_AMBULATORY_CARE_PROVIDER_SITE_OTHER): Payer: Medicare HMO | Admitting: Internal Medicine

## 2022-01-28 ENCOUNTER — Encounter: Payer: Self-pay | Admitting: Internal Medicine

## 2022-01-28 VITALS — BP 148/82 | HR 86 | Ht 63.0 in | Wt 163.0 lb

## 2022-01-28 DIAGNOSIS — I10 Essential (primary) hypertension: Secondary | ICD-10-CM | POA: Diagnosis not present

## 2022-01-28 DIAGNOSIS — Z1159 Encounter for screening for other viral diseases: Secondary | ICD-10-CM

## 2022-01-28 DIAGNOSIS — R053 Chronic cough: Secondary | ICD-10-CM | POA: Insufficient documentation

## 2022-01-28 DIAGNOSIS — Z1211 Encounter for screening for malignant neoplasm of colon: Secondary | ICD-10-CM

## 2022-01-28 DIAGNOSIS — R928 Other abnormal and inconclusive findings on diagnostic imaging of breast: Secondary | ICD-10-CM | POA: Diagnosis not present

## 2022-01-28 DIAGNOSIS — R0989 Other specified symptoms and signs involving the circulatory and respiratory systems: Secondary | ICD-10-CM | POA: Diagnosis not present

## 2022-01-28 DIAGNOSIS — R058 Other specified cough: Secondary | ICD-10-CM

## 2022-01-28 DIAGNOSIS — Z23 Encounter for immunization: Secondary | ICD-10-CM | POA: Diagnosis not present

## 2022-01-28 DIAGNOSIS — Z0000 Encounter for general adult medical examination without abnormal findings: Secondary | ICD-10-CM | POA: Diagnosis not present

## 2022-01-28 MED ORDER — SHINGRIX 50 MCG/0.5ML IM SUSR: 0.5000 mL | Freq: Once | INTRAMUSCULAR | 1 refills | Status: AC

## 2022-01-28 MED ORDER — FLUTICASONE PROPIONATE 50 MCG/ACT NA SUSP: 2.0000 | Freq: Every day | NASAL | 5 refills | Status: DC

## 2022-01-28 MED ORDER — MONTELUKAST SODIUM 10 MG PO TABS: 10.0000 mg | ORAL_TABLET | Freq: Every day | ORAL | 0 refills | Status: DC

## 2022-01-28 MED ORDER — HYDROCHLOROTHIAZIDE 25 MG PO TABS: 25.0000 mg | ORAL_TABLET | Freq: Every day | ORAL | 0 refills | Status: DC

## 2022-01-28 MED ORDER — LORATADINE 10 MG PO TABS: 10.0000 mg | ORAL_TABLET | Freq: Every day | ORAL | 1 refills | Status: DC

## 2022-01-28 NOTE — Progress Notes (Signed)
Date:  01/28/2022   Name:  Heather Dean   DOB:  1955-12-15   MRN:  [id number]   Chief Complaint: Annual Exam (Breast exam no pap ) Heather Dean is a 66 y.o. female who presents today for her Complete Annual Exam. She feels well. She reports exercising yard work once a week for a hour. She reports she is sleeping well. Breast complaints none. Recent mammogram with Ca+ but no mass.  Mammogram: 12/2021 left calcifications - repeat 6 mo Left Dx mammo DEXA: ordered Pap smear: discontinued Colonoscopy: 03/2016 repeat 5 yrs - pt cancelled earlier this month  Health Maintenance Due  Topic Date Due   Hepatitis C Screening  Never done   TETANUS/TDAP  Never done   Zoster Vaccines- Shingrix (1 of 2) Never done   Pneumonia Vaccine 82+ Years old (1 - PCV) Never done   DEXA SCAN  Never done   COVID-19 Vaccine (3 - Booster for Heather Dean series) 02/27/2020   COLONOSCOPY (Pts 45-46yr Insurance coverage will need to be confirmed)  04/01/2021    Immunization History  Administered Date(s) Administered   Fluad Quad(high Dose 65+) 08/18/2021   PFIZER(Purple Top)SARS-COV-2 Vaccination 11/30/2019, 01/02/2020    Cough This is a chronic problem. The current episode started more than 1 month ago. The problem has been unchanged. The problem occurs hourly. The cough is Productive of sputum. Associated symptoms include nasal congestion and postnasal drip. Pertinent negatives include no chest pain, chills, fever, headaches, rash, shortness of breath or wheezing. Treatments tried: antihistamines and Flonase. The treatment provided mild relief.   No results found for: NA, K, CO2, GLUCOSE, BUN, CREATININE, CALCIUM, EGFR, GFRNONAA, GLUCOSE No results found for: CHOL, HDL, LDLCALC, LDLDIRECT, TRIG, CHOLHDL No results found for: TSH No results found for: HGBA1C No results found for: WBC, HGB, HCT, MCV, PLT No results found for: ALT, AST, GGT, ALKPHOS, BILITOT No results found for: 25OHVITD2, 25OHVITD3, VD25OH    Review of Systems  Constitutional:  Negative for chills, fatigue and fever.  HENT:  Positive for postnasal drip. Negative for congestion, hearing loss, tinnitus, trouble swallowing and voice change.   Eyes:  Negative for visual disturbance.  Respiratory:  Positive for cough. Negative for chest tightness, shortness of breath and wheezing.   Cardiovascular:  Negative for chest pain, palpitations and leg swelling.  Gastrointestinal:  Negative for abdominal pain, constipation, diarrhea and vomiting.  Endocrine: Negative for polydipsia and polyuria.  Genitourinary:  Negative for dysuria, frequency, genital sores, vaginal bleeding and vaginal discharge.  Musculoskeletal:  Negative for arthralgias, gait problem and joint swelling.  Skin:  Negative for color change and rash.  Neurological:  Negative for dizziness, tremors, light-headedness and headaches.  Hematological:  Negative for adenopathy. Does not bruise/bleed easily.  Psychiatric/Behavioral:  Negative for dysphoric mood and sleep disturbance. The patient is not nervous/anxious.    Patient Active Problem List   Diagnosis Date Noted   Allergic cough 08/18/2021   Elevated BP without diagnosis of hypertension 08/18/2021    No Known Allergies  Past Surgical History:  Procedure Laterality Date   BREAST CYST ASPIRATION Left    BREAST EXCISIONAL BIOPSY Right    COLONOSCOPY     COLONOSCOPY WITH PROPOFOL N/A 04/01/2016   Procedure: COLONOSCOPY WITH PROPOFOL;  Surgeon: RManya Silvas MD;  [place] [facility]  Service: Endoscopy;  Laterality: N/A;   TONSILLECTOMY     TUBAL LIGATION      Social History   Tobacco Use   Smoking status:  Never   Smokeless tobacco: Never  Vaping Use   Vaping Use: Never used  Substance Use Topics   Alcohol use: Not Currently   Drug use: No     Medication list has been reviewed and updated.  Current Meds  Medication Sig   fexofenadine (ALLEGRA) 180 MG tablet Take 180 mg by mouth as needed  for allergies or rhinitis.   fluticasone (FLONASE) 50 MCG/ACT nasal spray Place 2 sprays into both nostrils daily.   guaiFENesin (MUCINEX PO) Take by mouth as needed.   loratadine (CLARITIN) 10 MG tablet Take 1 tablet (10 mg total) by mouth daily.   Multiple Vitamin (MULTIVITAMIN ADULT PO) Take by mouth.       01/28/2022    8:33 AM 08/18/2021    2:57 PM  GAD 7 : Generalized Anxiety Score  Nervous, Anxious, on Edge 0 0  Control/stop worrying 0 0  Worry too much - different things 0 0  Trouble relaxing 1 0  Restless 0 0  Easily annoyed or irritable 0 0  Afraid - awful might happen 0 0  Total GAD 7 Score 1 0  Anxiety Difficulty  Not difficult at all       01/28/2022    8:33 AM  Depression screen PHQ 2/9  Decreased Interest 0  Down, Depressed, Hopeless 0  PHQ - 2 Score 0  Altered sleeping 1  Tired, decreased energy 0  Change in appetite 0  Feeling bad or failure about yourself  0  Trouble concentrating 0  Moving slowly or fidgety/restless 0  Suicidal thoughts 0  PHQ-9 Score 1  Difficult doing work/chores Not difficult at all    BP Readings from Last 3 Encounters:  01/28/22 (!) 146/86  08/18/21 128/86  04/01/16 138/73    Physical Exam Vitals and nursing note reviewed.  Constitutional:      General: She is not in acute distress.    Appearance: She is well-developed.  HENT:     Head: Normocephalic and atraumatic.     Right Ear: Tympanic membrane and ear canal normal.     Left Ear: Tympanic membrane and ear canal normal.     Nose:     Right Sinus: No maxillary sinus tenderness or frontal sinus tenderness.     Left Sinus: No maxillary sinus tenderness or frontal sinus tenderness.     Mouth/Throat:     Palate: No lesions.     Pharynx: No oropharyngeal exudate or posterior oropharyngeal erythema.  Eyes:     General: No scleral icterus.       Right eye: No discharge.        Left eye: No discharge.     Conjunctiva/sclera: Conjunctivae normal.  Neck:     Thyroid: No  thyromegaly.     Vascular: Carotid bruit (faint right sided bruit) present.  Cardiovascular:     Rate and Rhythm: Normal rate and regular rhythm. No extrasystoles are present.    Pulses: Normal pulses.     Heart sounds: Normal heart sounds. No murmur heard. Pulmonary:     Effort: Pulmonary effort is normal. No respiratory distress.     Breath sounds: Normal breath sounds and air entry. Transmitted upper airway sounds present. No wheezing.     Comments: Frequent loose cough - mostly non productive. Chest:  Breasts:    Right: No mass, nipple discharge, skin change or tenderness.     Left: No mass, nipple discharge, skin change or tenderness.  Abdominal:     General: Abdomen  is flat. Bowel sounds are normal.     Palpations: Abdomen is soft.     Tenderness: There is no abdominal tenderness.  Musculoskeletal:     Cervical back: Normal range of motion. No erythema.     Right lower leg: No edema.     Left lower leg: No edema.  Lymphadenopathy:     Cervical: No cervical adenopathy.  Skin:    General: Skin is warm and dry.     Findings: No rash.  Neurological:     Mental Status: She is alert and oriented to person, place, and time.     Cranial Nerves: No cranial nerve deficit.     Sensory: No sensory deficit.     Deep Tendon Reflexes: Reflexes are normal and symmetric.  Psychiatric:        Attention and Perception: Attention normal.        Mood and Affect: Mood normal.    Wt Readings from Last 3 Encounters:  01/28/22 163 lb (73.9 kg)  08/18/21 170 lb (77.1 kg)  04/01/16 182 lb (82.6 kg)    BP (!) 146/86   Pulse 86   Ht '5\' 3"'  (1.6 m)   Wt 163 lb (73.9 kg)   SpO2 97%   BMI 28.87 kg/m   Assessment and Plan: 1. Annual physical exam New carotid bruit found. She wants to get Shingrix - Recommend continued work on diet and exercise as able. - Hemoglobin A1c - Lipid panel  2. Colon cancer screening She has postponed colonoscopy - urged to call to reschedule in the near  future.  3. Abnormal mammogram 6 mo follow up left mammogram recommended. No mass on exam today - pt will monitor.  4. Essential hypertension Persistent BP elevation - will need to start medications. Follow up in 6 weeks Recommend low sodium diet, exercise - CBC with Differential/Platelet - Comprehensive metabolic panel - TSH - hydrochlorothiazide (HYDRODIURIL) 25 MG tablet; Take 1 tablet (25 mg total) by mouth daily.  Dispense: 90 tablet; Refill: 0  5. Need for hepatitis C screening test - Hepatitis C antibody  6. Need for vaccination for pneumococcus Given today - Pneumococcal conjugate vaccine 20-valent  7. Bruit of right carotid artery Will obtain US Lipid panel ordered - will advise on medications. - US Carotid Duplex Bilateral; Future  8. Chronic cough Not controlled by Claritin/Flonase. Will needs CXR, trial of Singulair CXR findings will direct next steps - DG Chest 2 View - montelukast (SINGULAIR) 10 MG tablet; Take 1 tablet (10 mg total) by mouth at bedtime.  Dispense: 90 tablet; Refill: 0  9. Allergic cough Continue current regimen with the addition of Singulair. - loratadine (CLARITIN) 10 MG tablet; Take 1 tablet (10 mg total) by mouth daily.  Dispense: 90 tablet; Refill: 1 - fluticasone (FLONASE) 50 MCG/ACT nasal spray; Place 2 sprays into both nostrils daily.  Dispense: 16 g; Refill: 5  10. Need for shingles vaccine She can obtain this at the pharmacy. - Zoster Vaccine Adjuvanted [facility]) injection; Inject 0.5 mLs into the muscle once for 1 dose.  Dispense: 0.5 mL; Refill: 1   Partially dictated using Editor, commissioning. Any errors are unintentional.  Halina Maidens, MD [facility]  01/28/2022

## 2022-01-28 NOTE — Patient Instructions (Addendum)
Call to schedule your Bone Density.  -card given  Someone will call you to schedule the Carotid Ultrasound.  Take your usual allergy medications and ADD Montelukast (Singulair 10 mg)  Don't forget to reschedule the Colonoscopy.

## 2022-01-29 LAB — COMPREHENSIVE METABOLIC PANEL
ALT: 16 IU/L (ref 0–32)
AST: 15 IU/L (ref 0–40)
Albumin/Globulin Ratio: 2 (ref 1.2–2.2)
Albumin: 4.9 g/dL — ABNORMAL HIGH (ref 3.8–4.8)
Alkaline Phosphatase: 83 IU/L (ref 44–121)
BUN/Creatinine Ratio: 13 (ref 12–28)
BUN: 9 mg/dL (ref 8–27)
Bilirubin Total: 0.5 mg/dL (ref 0.0–1.2)
CO2: 23 mmol/L (ref 20–29)
Calcium: 10.2 mg/dL (ref 8.7–10.3)
Chloride: 103 mmol/L (ref 96–106)
Creatinine, Ser: 0.67 mg/dL (ref 0.57–1.00)
Globulin, Total: 2.4 g/dL (ref 1.5–4.5)
Glucose: 100 mg/dL — ABNORMAL HIGH (ref 70–99)
Potassium: 3.8 mmol/L (ref 3.5–5.2)
Sodium: 142 mmol/L (ref 134–144)
Total Protein: 7.3 g/dL (ref 6.0–8.5)
eGFR: 96 mL/min/{1.73_m2} (ref >59)

## 2022-01-29 LAB — CBC WITH DIFFERENTIAL/PLATELET
Basophils Absolute: 0 10*3/uL (ref 0.0–0.2)
Basos: 1 %
EOS (ABSOLUTE): 0.2 10*3/uL (ref 0.0–0.4)
Eos: 3 %
Hematocrit: 45.5 % (ref 34.0–46.6)
Hemoglobin: 15.5 g/dL (ref 11.1–15.9)
Immature Grans (Abs): 0 10*3/uL (ref 0.0–0.1)
Immature Granulocytes: 0 %
Lymphocytes Absolute: 1.7 10*3/uL (ref 0.7–3.1)
Lymphs: 25 %
MCH: 27.8 pg (ref 26.6–33.0)
MCHC: 34.1 g/dL (ref 31.5–35.7)
MCV: 82 fL (ref 79–97)
Monocytes Absolute: 0.5 10*3/uL (ref 0.1–0.9)
Monocytes: 8 %
Neutrophils Absolute: 4.4 10*3/uL (ref 1.4–7.0)
Neutrophils: 63 %
Platelets: 331 10*3/uL (ref 150–450)
RBC: 5.58 x10E6/uL — ABNORMAL HIGH (ref 3.77–5.28)
RDW: 13.7 % (ref 11.7–15.4)
WBC: 7 10*3/uL (ref 3.4–10.8)

## 2022-01-29 LAB — LIPID PANEL
Chol/HDL Ratio: 8 ratio — ABNORMAL HIGH (ref 0.0–4.4)
Cholesterol, Total: 319 mg/dL — ABNORMAL HIGH (ref 100–199)
HDL: 40 mg/dL (ref >39)
LDL Chol Calc (NIH): 246 mg/dL — ABNORMAL HIGH (ref 0–99)
Triglycerides: 167 mg/dL — ABNORMAL HIGH (ref 0–149)
VLDL Cholesterol Cal: 33 mg/dL (ref 5–40)

## 2022-01-29 LAB — HEMOGLOBIN A1C
Est. average glucose Bld gHb Est-mCnc: 111 mg/dL
Hgb A1c MFr Bld: 5.5 % (ref 4.8–5.6)

## 2022-01-29 LAB — TSH: TSH: 1.7 u[IU]/mL (ref 0.450–4.500)

## 2022-01-29 LAB — HEPATITIS C ANTIBODY: Hep C Virus Ab: NONREACTIVE

## 2022-01-30 ENCOUNTER — Encounter: Payer: Self-pay | Admitting: Internal Medicine

## 2022-01-30 DIAGNOSIS — E782 Mixed hyperlipidemia: Secondary | ICD-10-CM | POA: Insufficient documentation

## 2022-01-31 ENCOUNTER — Other Ambulatory Visit: Payer: Self-pay | Admitting: Internal Medicine

## 2022-01-31 DIAGNOSIS — E782 Mixed hyperlipidemia: Secondary | ICD-10-CM

## 2022-01-31 MED ORDER — ROSUVASTATIN CALCIUM 5 MG PO TABS: 5.0000 mg | ORAL_TABLET | Freq: Every day | ORAL | 1 refills | Status: DC

## 2022-01-31 NOTE — Progress Notes (Signed)
Pt informed. Pt verbalized understanding. Pt would like to start cholesterol medication and wants it sent it [facility] in [place]. ? ?KP

## 2022-02-01 NOTE — Progress Notes (Signed)
Called pt could not leave VM. Phone hung up. Called pt to let her phone that RX was sent in. ? ?[place] nurse may give results to patient if they return call to clinic, a CRM has been created. ? ?KP

## 2022-02-04 ENCOUNTER — Ambulatory Visit: Payer: Medicare HMO

## 2022-02-09 ENCOUNTER — Ambulatory Visit
Admission: RE | Admit: 2022-02-09 | Discharge: 2022-02-09 | Disposition: A | Discharge status: Home / Self Care | Payer: Medicare HMO | Source: Ambulatory Visit | Attending: Internal Medicine | Admitting: Internal Medicine

## 2022-02-09 DIAGNOSIS — R0989 Other specified symptoms and signs involving the circulatory and respiratory systems: Secondary | ICD-10-CM | POA: Diagnosis present

## 2022-02-10 ENCOUNTER — Other Ambulatory Visit: Payer: Self-pay | Admitting: Internal Medicine

## 2022-02-10 DIAGNOSIS — I6522 Occlusion and stenosis of left carotid artery: Secondary | ICD-10-CM

## 2022-03-10 ENCOUNTER — Other Ambulatory Visit: Payer: Medicare HMO

## 2022-03-11 ENCOUNTER — Ambulatory Visit: Payer: Medicare HMO | Admitting: Internal Medicine

## 2022-03-28 ENCOUNTER — Ambulatory Visit: Payer: Medicare HMO | Admitting: Internal Medicine

## 2022-04-04 ENCOUNTER — Encounter (INDEPENDENT_AMBULATORY_CARE_PROVIDER_SITE_OTHER): Payer: Medicare HMO | Admitting: Nurse Practitioner

## 2022-04-04 ENCOUNTER — Encounter (INDEPENDENT_AMBULATORY_CARE_PROVIDER_SITE_OTHER): Payer: Medicare HMO

## 2022-04-11 ENCOUNTER — Ambulatory Visit
Admission: RE | Admit: 2022-04-11 | Discharge: 2022-04-11 | Disposition: A | Discharge status: Home / Self Care | Payer: Medicare HMO | Source: Ambulatory Visit | Attending: Internal Medicine | Admitting: Internal Medicine

## 2022-04-11 DIAGNOSIS — Z780 Asymptomatic menopausal state: Secondary | ICD-10-CM | POA: Diagnosis present

## 2022-04-15 ENCOUNTER — Ambulatory Visit: Payer: Medicare HMO | Admitting: Internal Medicine

## 2022-04-25 ENCOUNTER — Other Ambulatory Visit: Payer: Self-pay | Admitting: Internal Medicine

## 2022-04-25 DIAGNOSIS — R053 Chronic cough: Secondary | ICD-10-CM

## 2022-04-25 DIAGNOSIS — I10 Essential (primary) hypertension: Secondary | ICD-10-CM

## 2022-04-26 ENCOUNTER — Other Ambulatory Visit (INDEPENDENT_AMBULATORY_CARE_PROVIDER_SITE_OTHER): Payer: Self-pay | Admitting: Nurse Practitioner

## 2022-04-26 DIAGNOSIS — I6522 Occlusion and stenosis of left carotid artery: Secondary | ICD-10-CM

## 2022-04-26 NOTE — Telephone Encounter (Signed)
Requested Prescriptions  Pending Prescriptions Disp Refills  . montelukast (SINGULAIR) 10 MG tablet [Pharmacy Med Name: MONTELUKAST SOD 10 MG TABLET] 90 tablet 0    Sig: TAKE 1 TABLET BY MOUTH EVERYDAY AT BEDTIME     Pulmonology:  Leukotriene Inhibitors Passed - 04/25/2022  2:02 AM      Passed - Valid encounter within last 12 months    Recent Outpatient Visits          2 months ago Annual physical exam   [facility] Glean Hess, MD   8 months ago Allergic cough   [facility] Glean Hess, MD      Future Appointments            In 6 days Glean Hess, MD [facility]           . hydrochlorothiazide (HYDRODIURIL) 25 MG tablet [Pharmacy Med Name: HYDROCHLOROTHIAZIDE 25 MG TAB] 90 tablet 0    Sig: TAKE 1 TABLET (25 MG TOTAL) BY MOUTH DAILY.     Cardiovascular: Diuretics - Thiazide Failed - 04/25/2022  2:02 AM      Failed - Last BP in normal range    BP Readings from Last 1 Encounters:  01/28/22 (!) 148/82         Passed - Cr in normal range and within 180 days    Creatinine, Ser  Date Value Ref Range Status  01/28/2022 0.67 0.57 - 1.00 mg/dL Final         Passed - K in normal range and within 180 days    Potassium  Date Value Ref Range Status  01/28/2022 3.8 3.5 - 5.2 mmol/L Final         Passed - Na in normal range and within 180 days    Sodium  Date Value Ref Range Status  01/28/2022 142 134 - 144 mmol/L Final         Passed - Valid encounter within last 6 months    Recent Outpatient Visits          2 months ago Annual physical exam   [facility] Glean Hess, MD   8 months ago Allergic cough   [facility] Glean Hess, MD      Future Appointments            In 6 days Glean Hess, MD [facility]

## 2022-04-27 ENCOUNTER — Encounter (INDEPENDENT_AMBULATORY_CARE_PROVIDER_SITE_OTHER): Payer: Self-pay | Admitting: Nurse Practitioner

## 2022-04-27 ENCOUNTER — Ambulatory Visit (INDEPENDENT_AMBULATORY_CARE_PROVIDER_SITE_OTHER): Payer: Medicare HMO | Admitting: Nurse Practitioner

## 2022-04-27 ENCOUNTER — Ambulatory Visit (INDEPENDENT_AMBULATORY_CARE_PROVIDER_SITE_OTHER): Payer: Medicare HMO

## 2022-04-27 VITALS — BP 151/81 | HR 67 | Resp 17 | Ht 62.0 in | Wt 160.0 lb

## 2022-04-27 DIAGNOSIS — I6522 Occlusion and stenosis of left carotid artery: Secondary | ICD-10-CM

## 2022-04-27 DIAGNOSIS — E782 Mixed hyperlipidemia: Secondary | ICD-10-CM | POA: Diagnosis not present

## 2022-04-27 DIAGNOSIS — I10 Essential (primary) hypertension: Secondary | ICD-10-CM

## 2022-04-27 MED ORDER — CLOPIDOGREL BISULFATE 75 MG PO TABS: 75.0000 mg | ORAL_TABLET | Freq: Every day | ORAL | 6 refills | Status: DC

## 2022-04-27 NOTE — H&P (View-Only) (Signed)
Subjective:    Patient ID: Heather Dean, female    DOB: [birth year], 66 y.o.   MRN: [id number] Chief Complaint  Patient presents with   Establish Care    Referred by Dr Army Melia    Heather Dean is a 66 year old female who presents today as a referral from her PCP Dr. Army Melia in regards to carotid artery stenosis.  The patient had a bruit auscultated during physical exam and was sent for carotid duplex.  Currently the patient has no signs symptoms of amaurosis fugax.  No TIA like symptoms or CVA symptoms.  Carotid duplex initially revealed a greater than 70% stenosis of the left ICA.  The right has mild carotid plaque less than 50%.  Studies done today correlate this and show velocities within the 80 to 99% range of the left ICA.  No evidence of significant stenosis in the right ICA.    Review of Systems  All other systems reviewed and are negative.      Objective:   Physical Exam Vitals reviewed.  HENT:     Head: Normocephalic.  Neck:     Vascular: Carotid bruit present.  Cardiovascular:     Rate and Rhythm: Normal rate.     Pulses: Normal pulses.  Pulmonary:     Effort: Pulmonary effort is normal.  Skin:    General: Skin is warm and dry.  Neurological:     Mental Status: She is alert and oriented to person, place, and time.  Psychiatric:        Mood and Affect: Mood normal.        Behavior: Behavior normal.        Thought Content: Thought content normal.        Judgment: Judgment normal.     BP (!) 151/81 (BP Location: Right Arm)   Pulse 67   Resp 17   Ht '5\' 2"'$  (1.575 m)   Wt 160 lb (72.6 kg)   BMI 29.26 kg/m   Past Medical History:  Diagnosis Date   Basal cell carcinoma    Hyperlipidemia    Hypertension    Obesity     Social History   Socioeconomic History   Marital status: Divorced    Spouse name: Not on file   Number of children: 0   Years of education: Not on file   Highest education level: Not on file  Occupational History   Not on file   Tobacco Use   Smoking status: Never   Smokeless tobacco: Never  Vaping Use   Vaping Use: Never used  Substance and Sexual Activity   Alcohol use: Not Currently   Drug use: No   Sexual activity: Not Currently    Birth control/protection: None  Other Topics Concern   Not on file  Social History Narrative   ** Merged History Encounter **       Pt lives alone    Social Determinants of Health   Financial Resource Strain: Low Risk  (01/05/2022)   Overall Financial Resource Strain (CARDIA)    Difficulty of Paying Living Expenses: Not hard at all  Food Insecurity: No Food Insecurity (01/05/2022)   Hunger Vital Sign    Worried About Running Out of Food in the Last Year: Never true    Ran Out of Food in the Last Year: Never true  Transportation Needs: No Transportation Needs (01/05/2022)   PRAPARE - Hydrologist (Medical): No    Lack of Transportation (Non-Medical): No  Physical Activity: Inactive (01/05/2022)   Exercise Vital Sign    Days of Exercise per Week: 0 days    Minutes of Exercise per Session: 0 min  Stress: No Stress Concern Present (01/05/2022)   [place]    Feeling of Stress : Only a little  Social Connections: Moderately Isolated (01/05/2022)   Social Connection and Isolation Panel [NHANES]    Frequency of Communication with Friends and Family: More than three times a week    Frequency of Social Gatherings with Friends and Family: More than three times a week    Attends Religious Services: More than 4 times per year    Active Member of [employer] or Organizations: No    Attends Archivist Meetings: Never    Marital Status: Divorced  Human resources officer Violence: Not At Risk (01/05/2022)   Humiliation, Afraid, Rape, and Kick questionnaire    Fear of Current or Ex-Partner: No    Emotionally Abused: No    Physically Abused: No    Sexually Abused: No    Past Surgical  History:  Procedure Laterality Date   BREAST CYST ASPIRATION Left    BREAST EXCISIONAL BIOPSY Right    COLONOSCOPY     COLONOSCOPY WITH PROPOFOL N/A 04/01/2016   Procedure: COLONOSCOPY WITH PROPOFOL;  Surgeon: Manya Silvas, MD;  Location: [facility];  Service: Endoscopy;  Laterality: N/A;   TONSILLECTOMY     TUBAL LIGATION      Family History  Problem Relation Age of Onset   Hypertension Mother    Heart disease Father    Melanoma Maternal Aunt    Breast cancer Paternal Aunt    Hypertension Maternal Grandmother    Hypertension Maternal Grandfather    Cancer Maternal Grandfather    Hypertension Brother     No Known Allergies     Latest Ref Rng & Units 01/28/2022    9:13 AM  CBC  WBC 3.4 - 10.8 x10E3/uL 7.0   Hemoglobin 11.1 - 15.9 g/dL 15.5   Hematocrit 34.0 - 46.6 % 45.5   Platelets 150 - 450 x10E3/uL 331       CMP     Component Value Date/Time   NA 142 01/28/2022 0913   K 3.8 01/28/2022 0913   CL 103 01/28/2022 0913   CO2 23 01/28/2022 0913   GLUCOSE 100 (H) 01/28/2022 0913   BUN 9 01/28/2022 0913   CREATININE 0.67 01/28/2022 0913   CALCIUM 10.2 01/28/2022 0913   PROT 7.3 01/28/2022 0913   ALBUMIN 4.9 (H) 01/28/2022 0913   AST 15 01/28/2022 0913   ALT 16 01/28/2022 0913   ALKPHOS 83 01/28/2022 0913   BILITOT 0.5 01/28/2022 0913     No results found.     Assessment & Plan:   1. Left carotid stenosis The patient remains asymptomatic with respect to the carotid stenosis.  However, the patient has now progressed and has a lesion the is >70%.  Patient should undergo CT angiography of the carotid arteries to define the degree of stenosis of the internal carotid arteries bilaterally and the anatomic suitability for surgery vs. intervention.  If the patient does indeed need surgery cardiac clearance will be required, once cleared the patient will be scheduled for surgery.  The risks, benefits and alternative therapies were reviewed in detail with  the patient.  All questions were answered.  The patient agrees to proceed with imaging.  Patient prescribed Plavix.  Also advised to begin taking  an over-the-counter 81 mg aspirin. Continue management of CAD, HTN and Hyperlipidemia. Healthy heart diet, encouraged exercise at least 4 times per week.   - clopidogrel (PLAVIX) 75 MG tablet; Take 1 tablet (75 mg total) by mouth daily.  Dispense: 30 tablet; Refill: 6 - CT ANGIO NECK W OR WO CONTRAST; Future  2. Mixed hyperlipidemia Continue statin as ordered and reviewed, no changes at this time   3. Essential hypertension Continue antihypertensive medications as already ordered, these medications have been reviewed and there are no changes at this time.    Current Outpatient Medications on File Prior to Visit  Medication Sig Dispense Refill   fexofenadine (ALLEGRA) 180 MG tablet Take 180 mg by mouth as needed for allergies or rhinitis.     fluticasone (FLONASE) 50 MCG/ACT nasal spray Place 2 sprays into both nostrils daily. 16 g 5   guaiFENesin (MUCINEX PO) Take by mouth as needed.     hydrochlorothiazide (HYDRODIURIL) 25 MG tablet TAKE 1 TABLET (25 MG TOTAL) BY MOUTH DAILY. 90 tablet 0   loratadine (CLARITIN) 10 MG tablet Take 1 tablet (10 mg total) by mouth daily. 90 tablet 1   montelukast (SINGULAIR) 10 MG tablet TAKE 1 TABLET BY MOUTH EVERYDAY AT BEDTIME 90 tablet 0   Multiple Vitamin (MULTIVITAMIN ADULT PO) Take by mouth.     rosuvastatin (CRESTOR) 5 MG tablet Take 1 tablet (5 mg total) by mouth daily. 90 tablet 1   No current facility-administered medications on file prior to visit.    There are no Patient Instructions on file for this visit. No follow-ups on file.   Kris Hartmann, NP

## 2022-04-27 NOTE — Progress Notes (Signed)
Subjective:    Patient ID: Heather Dean, female    DOB: 08-19-1956, 66 y.o.   MRN: [id number] Chief Complaint  Patient presents with   Establish Care    Referred by Dr Army Melia    Heather Dean is a 66 year old female who presents today as a referral from her PCP Dr. Army Melia in regards to carotid artery stenosis.  The patient had a bruit auscultated during physical exam and was sent for carotid duplex.  Currently the patient has no signs symptoms of amaurosis fugax.  No TIA like symptoms or CVA symptoms.  Carotid duplex initially revealed a greater than 70% stenosis of the left ICA.  The right has mild carotid plaque less than 50%.  Studies done today correlate this and show velocities within the 80 to 99% range of the left ICA.  No evidence of significant stenosis in the right ICA.    Review of Systems  All other systems reviewed and are negative.      Objective:   Physical Exam Vitals reviewed.  HENT:     Head: Normocephalic.  Neck:     Vascular: Carotid bruit present.  Cardiovascular:     Rate and Rhythm: Normal rate.     Pulses: Normal pulses.  Pulmonary:     Effort: Pulmonary effort is normal.  Skin:    General: Skin is warm and dry.  Neurological:     Mental Status: She is alert and oriented to person, place, and time.  Psychiatric:        Mood and Affect: Mood normal.        Behavior: Behavior normal.        Thought Content: Thought content normal.        Judgment: Judgment normal.     BP (!) 151/81 (BP Location: Right Arm)   Pulse 67   Resp 17   Ht '5\' 2"'$  (1.575 m)   Wt 160 lb (72.6 kg)   BMI 29.26 kg/m   Past Medical History:  Diagnosis Date   Basal cell carcinoma    Hyperlipidemia    Hypertension    Obesity     Social History   Socioeconomic History   Marital status: Divorced    Spouse name: Not on file   Number of children: 0   Years of education: Not on file   Highest education level: Not on file  Occupational History   Not on file   Tobacco Use   Smoking status: Never   Smokeless tobacco: Never  Vaping Use   Vaping Use: Never used  Substance and Sexual Activity   Alcohol use: Not Currently   Drug use: No   Sexual activity: Not Currently    Birth control/protection: None  Other Topics Concern   Not on file  Social History Narrative   ** Merged History Encounter **       Pt lives alone    Social Determinants of Health   Financial Resource Strain: Low Risk  (01/05/2022)   Overall Financial Resource Strain (CARDIA)    Difficulty of Paying Living Expenses: Not hard at all  Food Insecurity: No Food Insecurity (01/05/2022)   Hunger Vital Sign    Worried About Running Out of Food in the Last Year: Never true    Ran Out of Food in the Last Year: Never true  Transportation Needs: No Transportation Needs (01/05/2022)   PRAPARE - Hydrologist (Medical): No    Lack of Transportation (Non-Medical): No  Physical Activity: Inactive (01/05/2022)   Exercise Vital Sign    Days of Exercise per Week: 0 days    Minutes of Exercise per Session: 0 min  Stress: No Stress Concern Present (01/05/2022)   [place]    Feeling of Stress : Only a little  Social Connections: Moderately Isolated (01/05/2022)   Social Connection and Isolation Panel [NHANES]    Frequency of Communication with Friends and Family: More than three times a week    Frequency of Social Gatherings with Friends and Family: More than three times a week    Attends Religious Services: More than 4 times per year    Active Member of [employer] or Organizations: No    Attends Archivist Meetings: Never    Marital Status: Divorced  Human resources officer Violence: Not At Risk (01/05/2022)   Humiliation, Afraid, Rape, and Kick questionnaire    Fear of Current or Ex-Partner: No    Emotionally Abused: No    Physically Abused: No    Sexually Abused: No    Past Surgical  History:  Procedure Laterality Date   BREAST CYST ASPIRATION Left    BREAST EXCISIONAL BIOPSY Right    COLONOSCOPY     COLONOSCOPY WITH PROPOFOL N/A 04/01/2016   Procedure: COLONOSCOPY WITH PROPOFOL;  Surgeon: Manya Silvas, MD;  Location: [place];  Service: Endoscopy;  Laterality: N/A;   TONSILLECTOMY     TUBAL LIGATION      Family History  Problem Relation Age of Onset   Hypertension Mother    Heart disease Father    Melanoma Maternal Aunt    Breast cancer Paternal Aunt    Hypertension Maternal Grandmother    Hypertension Maternal Grandfather    Cancer Maternal Grandfather    Hypertension Brother     No Known Allergies     Latest Ref Rng & Units 01/28/2022    9:13 AM  CBC  WBC 3.4 - 10.8 x10E3/uL 7.0   Hemoglobin 11.1 - 15.9 g/dL 15.5   Hematocrit 34.0 - 46.6 % 45.5   Platelets 150 - 450 x10E3/uL 331       CMP     Component Value Date/Time   NA 142 01/28/2022 0913   K 3.8 01/28/2022 0913   CL 103 01/28/2022 0913   CO2 23 01/28/2022 0913   GLUCOSE 100 (H) 01/28/2022 0913   BUN 9 01/28/2022 0913   CREATININE 0.67 01/28/2022 0913   CALCIUM 10.2 01/28/2022 0913   PROT 7.3 01/28/2022 0913   ALBUMIN 4.9 (H) 01/28/2022 0913   AST 15 01/28/2022 0913   ALT 16 01/28/2022 0913   ALKPHOS 83 01/28/2022 0913   BILITOT 0.5 01/28/2022 0913     No results found.     Assessment & Plan:   1. Left carotid stenosis The patient remains asymptomatic with respect to the carotid stenosis.  However, the patient has now progressed and has a lesion the is >70%.  Patient should undergo CT angiography of the carotid arteries to define the degree of stenosis of the internal carotid arteries bilaterally and the anatomic suitability for surgery vs. intervention.  If the patient does indeed need surgery cardiac clearance will be required, once cleared the patient will be scheduled for surgery.  The risks, benefits and alternative therapies were reviewed in detail with  the patient.  All questions were answered.  The patient agrees to proceed with imaging.  Patient prescribed Plavix.  Also advised to begin taking  an over-the-counter 81 mg aspirin. Continue management of CAD, HTN and Hyperlipidemia. Healthy heart diet, encouraged exercise at least 4 times per week.   - clopidogrel (PLAVIX) 75 MG tablet; Take 1 tablet (75 mg total) by mouth daily.  Dispense: 30 tablet; Refill: 6 - CT ANGIO NECK W OR WO CONTRAST; Future  2. Mixed hyperlipidemia Continue statin as ordered and reviewed, no changes at this time   3. Essential hypertension Continue antihypertensive medications as already ordered, these medications have been reviewed and there are no changes at this time.    Current Outpatient Medications on File Prior to Visit  Medication Sig Dispense Refill   fexofenadine (ALLEGRA) 180 MG tablet Take 180 mg by mouth as needed for allergies or rhinitis.     fluticasone (FLONASE) 50 MCG/ACT nasal spray Place 2 sprays into both nostrils daily. 16 g 5   guaiFENesin (MUCINEX PO) Take by mouth as needed.     hydrochlorothiazide (HYDRODIURIL) 25 MG tablet TAKE 1 TABLET (25 MG TOTAL) BY MOUTH DAILY. 90 tablet 0   loratadine (CLARITIN) 10 MG tablet Take 1 tablet (10 mg total) by mouth daily. 90 tablet 1   montelukast (SINGULAIR) 10 MG tablet TAKE 1 TABLET BY MOUTH EVERYDAY AT BEDTIME 90 tablet 0   Multiple Vitamin (MULTIVITAMIN ADULT PO) Take by mouth.     rosuvastatin (CRESTOR) 5 MG tablet Take 1 tablet (5 mg total) by mouth daily. 90 tablet 1   No current facility-administered medications on file prior to visit.    There are no Patient Instructions on file for this visit. No follow-ups on file.   Kris Hartmann, NP

## 2022-04-29 ENCOUNTER — Telehealth (INDEPENDENT_AMBULATORY_CARE_PROVIDER_SITE_OTHER): Payer: Self-pay | Admitting: Nurse Practitioner

## 2022-04-29 NOTE — Telephone Encounter (Signed)
LVM for pt advising that I was waiting on the auth to come through for her CT and that I was hoping to have that today. After receiving, I will call her to let her know to schedule.

## 2022-05-02 ENCOUNTER — Encounter: Payer: Self-pay | Admitting: Internal Medicine

## 2022-05-02 ENCOUNTER — Telehealth (INDEPENDENT_AMBULATORY_CARE_PROVIDER_SITE_OTHER): Payer: Self-pay | Admitting: Nurse Practitioner

## 2022-05-02 ENCOUNTER — Ambulatory Visit (INDEPENDENT_AMBULATORY_CARE_PROVIDER_SITE_OTHER): Payer: Medicare HMO | Admitting: Internal Medicine

## 2022-05-02 VITALS — BP 110/62 | HR 83 | Ht 62.0 in | Wt 160.0 lb

## 2022-05-02 DIAGNOSIS — R0989 Other specified symptoms and signs involving the circulatory and respiratory systems: Secondary | ICD-10-CM | POA: Diagnosis not present

## 2022-05-02 DIAGNOSIS — I10 Essential (primary) hypertension: Secondary | ICD-10-CM | POA: Diagnosis not present

## 2022-05-02 DIAGNOSIS — E782 Mixed hyperlipidemia: Secondary | ICD-10-CM | POA: Diagnosis not present

## 2022-05-02 NOTE — Progress Notes (Signed)
Date:  05/02/2022   Name:  Heather Dean   DOB:  12/07/1955   MRN:  [id number]   Chief Complaint: Hypertension and Hyperlipidemia  Hypertension This is a new problem. The current episode started more than 1 month ago. Pertinent negatives include no chest pain, headaches, palpitations or shortness of breath. Past treatments include diuretics (HCTZ started last visit).  Hyperlipidemia This is a chronic problem. Pertinent negatives include no chest pain or shortness of breath. Current antihyperlipidemic treatment includes statins (started last visit).  Carotid US showed high grade stenosis - referred to VS and has CT angio scheduled for tomorrow for surgery planning. She has started on Plavix and ASA.   Lab Results  Component Value Date   NA 142 01/28/2022   K 3.8 01/28/2022   CO2 23 01/28/2022   GLUCOSE 100 (H) 01/28/2022   BUN 9 01/28/2022   CREATININE 0.67 01/28/2022   CALCIUM 10.2 01/28/2022   EGFR 96 01/28/2022   Lab Results  Component Value Date   CHOL 319 (H) 01/28/2022   HDL 40 01/28/2022   LDLCALC 246 (H) 01/28/2022   TRIG 167 (H) 01/28/2022   CHOLHDL 8.0 (H) 01/28/2022   Lab Results  Component Value Date   TSH 1.700 01/28/2022   Lab Results  Component Value Date   HGBA1C 5.5 01/28/2022   Lab Results  Component Value Date   WBC 7.0 01/28/2022   HGB 15.5 01/28/2022   HCT 45.5 01/28/2022   MCV 82 01/28/2022   PLT 331 01/28/2022   Lab Results  Component Value Date   ALT 16 01/28/2022   AST 15 01/28/2022   ALKPHOS 83 01/28/2022   BILITOT 0.5 01/28/2022   No results found for: "25OHVITD2", "25OHVITD3", "VD25OH"   Review of Systems  Constitutional:  Negative for fatigue and unexpected weight change.  HENT:  Negative for nosebleeds.   Eyes:  Negative for visual disturbance.  Respiratory:  Negative for cough, chest tightness, shortness of breath and wheezing.   Cardiovascular:  Negative for chest pain, palpitations and leg swelling.  Gastrointestinal:   Negative for abdominal pain, constipation and diarrhea.  Neurological:  Negative for dizziness, weakness, light-headedness and headaches.    Patient Active Problem List   Diagnosis Date Noted   Mixed hyperlipidemia 01/30/2022   Bruit of right carotid artery 01/28/2022   Abnormal mammogram 01/28/2022   Allergic cough 08/18/2021   Essential hypertension 08/18/2021    No Known Allergies  Past Surgical History:  Procedure Laterality Date   BREAST CYST ASPIRATION Left    BREAST EXCISIONAL BIOPSY Right    COLONOSCOPY     COLONOSCOPY WITH PROPOFOL N/A 04/01/2016   Procedure: COLONOSCOPY WITH PROPOFOL;  Surgeon: Manya Silvas, MD;  Location: [place];  Service: Endoscopy;  Laterality: N/A;   TONSILLECTOMY     TUBAL LIGATION      Social History   Tobacco Use   Smoking status: Never   Smokeless tobacco: Never  Vaping Use   Vaping Use: Never used  Substance Use Topics   Alcohol use: Not Currently   Drug use: No     Medication list has been reviewed and updated.  Current Meds  Medication Sig   clopidogrel (PLAVIX) 75 MG tablet Take 1 tablet (75 mg total) by mouth daily.   fexofenadine (ALLEGRA) 180 MG tablet Take 180 mg by mouth as needed for allergies or rhinitis.   fluticasone (FLONASE) 50 MCG/ACT nasal spray Place 2 sprays into both nostrils daily.   hydrochlorothiazide (  HYDRODIURIL) 25 MG tablet TAKE 1 TABLET (25 MG TOTAL) BY MOUTH DAILY.   loratadine (CLARITIN) 10 MG tablet Take 1 tablet (10 mg total) by mouth daily.   montelukast (SINGULAIR) 10 MG tablet TAKE 1 TABLET BY MOUTH EVERYDAY AT BEDTIME   Multiple Vitamin (MULTIVITAMIN ADULT PO) Take by mouth.   rosuvastatin (CRESTOR) 5 MG tablet Take 1 tablet (5 mg total) by mouth daily.       05/02/2022    8:30 AM 01/28/2022    8:33 AM 08/18/2021    2:57 PM  GAD 7 : Generalized Anxiety Score  Nervous, Anxious, on Edge 0 0 0  Control/stop worrying 0 0 0  Worry too much - different things 0 0 0  Trouble relaxing  0 1 0  Restless 0 0 0  Easily annoyed or irritable 0 0 0  Afraid - awful might happen 0 0 0  Total GAD 7 Score 0 1 0  Anxiety Difficulty Not difficult at all  Not difficult at all       05/02/2022    8:30 AM 01/28/2022    8:33 AM 01/05/2022   10:50 AM  Depression screen PHQ 2/9  Decreased Interest 0 0 0  Down, Depressed, Hopeless 0 0 0  PHQ - 2 Score 0 0 0  Altered sleeping 2 1   Tired, decreased energy 1 0   Change in appetite 0 0   Feeling bad or failure about yourself  0 0   Trouble concentrating 0 0   Moving slowly or fidgety/restless 0 0   Suicidal thoughts 0 0   PHQ-9 Score 3 1   Difficult doing work/chores Not difficult at all Not difficult at all     BP Readings from Last 3 Encounters:  05/02/22 110/62  04/27/22 (!) 151/81  01/28/22 (!) 148/82    Physical Exam Vitals and nursing note reviewed.  Constitutional:      General: She is not in acute distress.    Appearance: Normal appearance. She is well-developed.  HENT:     Head: Normocephalic and atraumatic.  Neck:     Vascular: Carotid bruit present.  Cardiovascular:     Rate and Rhythm: Normal rate and regular rhythm.     Pulses: Normal pulses.     Heart sounds: No murmur heard. Pulmonary:     Effort: Pulmonary effort is normal. No respiratory distress.     Breath sounds: No wheezing or rhonchi.  Musculoskeletal:     Cervical back: Normal range of motion.     Right lower leg: No edema.     Left lower leg: No edema.  Lymphadenopathy:     Cervical: No cervical adenopathy.  Skin:    General: Skin is warm and dry.     Findings: No rash.  Neurological:     Mental Status: She is alert and oriented to person, place, and time.  Psychiatric:        Mood and Affect: Mood normal.        Behavior: Behavior normal.     Wt Readings from Last 3 Encounters:  05/02/22 160 lb (72.6 kg)  04/27/22 160 lb (72.6 kg)  01/28/22 163 lb (73.9 kg)    BP 110/62   Pulse 83   Ht _0  (1.575 m)   Wt 160 lb (72.6 kg)    SpO2 95%   BMI 29.26 kg/m   Assessment and Plan: 1. Essential hypertension Clinically stable exam with well controlled BP since adding HCTZ Tolerating medications  without side effects at this time. Pt to continue current regimen and low sodium diet; benefits of regular exercise as able discussed. - Comprehensive metabolic panel - EKG 30-SPQZ - SNR @ 75; non specific T wave changes, low voltage  2. Mixed hyperlipidemia Now on Crestor 5 mg without side effect Will check labs and advise on dose adjustment - Lipid panel  3. Bruit of right carotid artery Surgery being planned EKG done today in anticipation of pre-op recommendations   Partially dictated using Dragon software. Any errors are unintentional.  Halina Maidens, MD [facility]  05/02/2022

## 2022-05-02 NOTE — Telephone Encounter (Signed)
LVM for pt TCB and schedule CT results appt.

## 2022-05-03 ENCOUNTER — Ambulatory Visit
Admission: RE | Admit: 2022-05-03 | Discharge: 2022-05-03 | Disposition: A | Discharge status: Home / Self Care | Payer: Medicare HMO | Source: Ambulatory Visit | Attending: Nurse Practitioner | Admitting: Nurse Practitioner

## 2022-05-03 ENCOUNTER — Other Ambulatory Visit: Payer: Self-pay

## 2022-05-03 DIAGNOSIS — I10 Essential (primary) hypertension: Secondary | ICD-10-CM

## 2022-05-03 DIAGNOSIS — I6522 Occlusion and stenosis of left carotid artery: Secondary | ICD-10-CM | POA: Insufficient documentation

## 2022-05-03 LAB — COMPREHENSIVE METABOLIC PANEL
ALT: 13 IU/L (ref 0–32)
AST: 20 IU/L (ref 0–40)
Albumin/Globulin Ratio: 2.1 (ref 1.2–2.2)
Albumin: 4.7 g/dL (ref 3.9–4.9)
Alkaline Phosphatase: 67 IU/L (ref 44–121)
BUN/Creatinine Ratio: 24 (ref 12–28)
BUN: 17 mg/dL (ref 8–27)
Bilirubin Total: 0.4 mg/dL (ref 0.0–1.2)
CO2: 24 mmol/L (ref 20–29)
Calcium: 10.3 mg/dL (ref 8.7–10.3)
Chloride: 104 mmol/L (ref 96–106)
Creatinine, Ser: 0.72 mg/dL (ref 0.57–1.00)
Globulin, Total: 2.2 g/dL (ref 1.5–4.5)
Glucose: 112 mg/dL — ABNORMAL HIGH (ref 70–99)
Potassium: 4.5 mmol/L (ref 3.5–5.2)
Sodium: 143 mmol/L (ref 134–144)
Total Protein: 6.9 g/dL (ref 6.0–8.5)
eGFR: 92 mL/min/{1.73_m2} (ref >59)

## 2022-05-03 LAB — LIPID PANEL
Chol/HDL Ratio: 4.3 ratio (ref 0.0–4.4)
Cholesterol, Total: 181 mg/dL (ref 100–199)
HDL: 42 mg/dL (ref >39)
LDL Chol Calc (NIH): 115 mg/dL — ABNORMAL HIGH (ref 0–99)
Triglycerides: 135 mg/dL (ref 0–149)
VLDL Cholesterol Cal: 24 mg/dL (ref 5–40)

## 2022-05-03 MED ORDER — HYDROCHLOROTHIAZIDE 25 MG PO TABS: 25.0000 mg | ORAL_TABLET | Freq: Every day | ORAL | 0 refills | Status: DC

## 2022-05-03 MED ORDER — IOHEXOL 350 MG/ML SOLN
75.0000 mL | Freq: Once | INTRAVENOUS | Status: AC | PRN
  Administered 2022-05-03: 75 mL via INTRAVENOUS

## 2022-05-13 ENCOUNTER — Other Ambulatory Visit: Payer: Self-pay | Admitting: Internal Medicine

## 2022-05-13 ENCOUNTER — Encounter (INDEPENDENT_AMBULATORY_CARE_PROVIDER_SITE_OTHER): Payer: Self-pay | Admitting: Vascular Surgery

## 2022-05-13 ENCOUNTER — Ambulatory Visit (INDEPENDENT_AMBULATORY_CARE_PROVIDER_SITE_OTHER): Payer: Medicare HMO | Admitting: Vascular Surgery

## 2022-05-13 VITALS — BP 158/82 | HR 99 | Resp 15 | Wt 158.6 lb

## 2022-05-13 DIAGNOSIS — I10 Essential (primary) hypertension: Secondary | ICD-10-CM

## 2022-05-13 DIAGNOSIS — I6529 Occlusion and stenosis of unspecified carotid artery: Secondary | ICD-10-CM | POA: Insufficient documentation

## 2022-05-13 DIAGNOSIS — I6522 Occlusion and stenosis of left carotid artery: Secondary | ICD-10-CM

## 2022-05-13 DIAGNOSIS — E782 Mixed hyperlipidemia: Secondary | ICD-10-CM

## 2022-05-13 NOTE — Assessment & Plan Note (Signed)
blood pressure control important in reducing the progression of atherosclerotic disease. On appropriate oral medications.  

## 2022-05-13 NOTE — H&P (View-Only) (Signed)
Carotid stenosis I have independently reviewed her CT angiogram of her neck.  The patient clearly has a high-grade left carotid artery stenosis and I would generally agree with the 80% stenosis described from the official report although it is that for a little worse.  The right side has no significant disease. I had a long discussion today with the patient about the pathophysiology and natural history of carotid artery disease.  She is on appropriate medical therapy with aspirin, Plavix, and statin agent.  With a high-grade lesion, she would benefit from intervention to reduce her stroke risk over time.  I discussed the differences between carotid artery stenting and carotid endarterectomy.  Her anatomy appears to be acceptable for either technique.  She has a somewhat high bifurcation although likely not prohibitively high and it is a short segment lesion.  There is very minimal calcification and does not appear particularly tortuous.  After discussions, the patient would like to proceed with left carotid stent placement.  Risks and benefits were discussed and she is agreeable to proceed.  Essential hypertension blood pressure control important in reducing the progression of atherosclerotic disease. On appropriate oral medications.   Mixed hyperlipidemia lipid control important in reducing the progression of atherosclerotic disease. Continue statin therapy

## 2022-05-13 NOTE — Telephone Encounter (Signed)
Rx 01/31/22 #90 1RF- 6 month supply Requested Prescriptions  Pending Prescriptions Disp Refills  . rosuvastatin (CRESTOR) 5 MG tablet [Pharmacy Med Name: ROSUVASTATIN CALCIUM 5 MG TAB] 90 tablet 1    Sig: TAKE 1 TABLET (5 MG TOTAL) BY MOUTH DAILY.     Cardiovascular:  Antilipid - Statins 2 Failed - 05/13/2022  2:34 AM      Failed - Lipid Panel in normal range within the last 12 months    Cholesterol, Total  Date Value Ref Range Status  05/02/2022 181 100 - 199 mg/dL Final   LDL Chol Calc (NIH)  Date Value Ref Range Status  05/02/2022 115 (H) 0 - 99 mg/dL Final   HDL  Date Value Ref Range Status  05/02/2022 42 >39 mg/dL Final   Triglycerides  Date Value Ref Range Status  05/02/2022 135 0 - 149 mg/dL Final         Passed - Cr in normal range and within 360 days    Creatinine, Ser  Date Value Ref Range Status  05/02/2022 0.72 0.57 - 1.00 mg/dL Final         Passed - Patient is not pregnant      Passed - Valid encounter within last 12 months    Recent Outpatient Visits          1 week ago Essential hypertension   [facility] Glean Hess, MD   3 months ago Annual physical exam   [facility] Glean Hess, MD   8 months ago Allergic cough   [facility] Glean Hess, MD

## 2022-05-13 NOTE — Progress Notes (Signed)
MRN : [id number]  Heather Dean is a 66 y.o. (Aug 20, 1956) female who presents with chief complaint of  Chief Complaint  Patient presents with   Follow-up    Ct results  .  History of Present Illness: Patient returns today in follow up of her carotid disease.  This was found incidentally on an astute pickup by her primary care physician of a bruit at her yearly physical.  She underwent a carotid duplex which showed a high-grade left carotid stenosis and then went for a CT angiogram which I have independently reviewed.  She denies any focal neurologic symptoms. Specifically, the patient denies amaurosis fugax, speech or swallowing difficulties, or arm or leg weakness or numbness. I have independently reviewed her CT angiogram of her neck.  The patient clearly has a high-grade left carotid artery stenosis and I would generally agree with the 80% stenosis described from the official report although it is that for a little worse.  The right side has no significant disease.  Current Outpatient Medications  Medication Sig Dispense Refill   clopidogrel (PLAVIX) 75 MG tablet Take 1 tablet (75 mg total) by mouth daily. 30 tablet 6   fluticasone (FLONASE) 50 MCG/ACT nasal spray Place 2 sprays into both nostrils daily. 16 g 5   hydrochlorothiazide (HYDRODIURIL) 25 MG tablet Take 1 tablet (25 mg total) by mouth daily. 90 tablet 0   montelukast (SINGULAIR) 10 MG tablet TAKE 1 TABLET BY MOUTH EVERYDAY AT BEDTIME 90 tablet 0   Multiple Vitamin (MULTIVITAMIN ADULT PO) Take by mouth.     rosuvastatin (CRESTOR) 5 MG tablet Take 1 tablet (5 mg total) by mouth daily. 90 tablet 1   fexofenadine (ALLEGRA) 180 MG tablet Take 180 mg by mouth as needed for allergies or rhinitis.     loratadine (CLARITIN) 10 MG tablet Take 1 tablet (10 mg total) by mouth daily. 90 tablet 1   No current facility-administered medications for this visit.    Past Medical History:  Diagnosis Date   Basal cell carcinoma     Hyperlipidemia    Hypertension    Obesity     Past Surgical History:  Procedure Laterality Date   BREAST CYST ASPIRATION Left    BREAST EXCISIONAL BIOPSY Right    COLONOSCOPY     COLONOSCOPY WITH PROPOFOL N/A 04/01/2016   Procedure: COLONOSCOPY WITH PROPOFOL;  Surgeon: Manya Silvas, MD;  Location: [facility];  Service: Endoscopy;  Laterality: N/A;   TONSILLECTOMY     TUBAL LIGATION       Social History   Tobacco Use   Smoking status: Never   Smokeless tobacco: Never  Vaping Use   Vaping Use: Never used  Substance Use Topics   Alcohol use: Not Currently   Drug use: No      Family History  Problem Relation Age of Onset   Hypertension Mother    Heart disease Father    Melanoma Maternal Aunt    Breast cancer Paternal Aunt    Hypertension Maternal Grandmother    Hypertension Maternal Grandfather    Cancer Maternal Grandfather    Hypertension Brother      No Known Allergies   REVIEW OF SYSTEMS (Negative unless checked)  Constitutional: '[]' Weight loss  '[]' Fever  '[]' Chills Cardiac: '[]' Chest pain   '[]' Chest pressure   '[]' Palpitations   '[]' Shortness of breath when laying flat   '[]' Shortness of breath at rest   '[]' Shortness of breath with exertion. Vascular:  '[]' Pain in legs with walking   '[]'   Pain in legs at rest   '[]' Pain in legs when laying flat   '[]' Claudication   '[]' Pain in feet when walking  '[]' Pain in feet at rest  '[]' Pain in feet when laying flat   '[]' History of DVT   '[]' Phlebitis   '[]' Swelling in legs   '[]' Varicose veins   '[]' Non-healing ulcers Pulmonary:   '[]' Uses home oxygen   '[]' Productive cough   '[]' Hemoptysis   '[]' Wheeze  '[]' COPD   '[]' Asthma Neurologic:  '[]' Dizziness  '[]' Blackouts   '[]' Seizures   '[]' History of stroke   '[]' History of TIA  '[]' Aphasia   '[]' Temporary blindness   '[]' Dysphagia   '[]' Weakness or numbness in arms   '[]' Weakness or numbness in legs Musculoskeletal:  '[]' Arthritis   '[]' Joint swelling   '[]' Joint pain   '[]' Low back pain Hematologic:  '[]' Easy bruising  '[]' Easy bleeding    '[]' Hypercoagulable state   '[]' Anemic   Gastrointestinal:  '[]' Blood in stool   '[]' Vomiting blood  '[]' Gastroesophageal reflux/heartburn   '[]' Abdominal pain Genitourinary:  '[]' Chronic kidney disease   '[]' Difficult urination  '[]' Frequent urination  '[]' Burning with urination   '[]' Hematuria Skin:  '[]' Rashes   '[]' Ulcers   '[]' Wounds Psychological:  '[]' History of anxiety   '[]'  History of major depression.  Physical Examination  BP (!) 158/82 (BP Location: Right Arm)   Pulse 99   Resp 15   Wt 158 lb 9.6 oz (71.9 kg)   BMI 29.01 kg/m  Gen:  WD/WN, NAD Head: [place]/AT, No temporalis wasting. Ear/Nose/Throat: Hearing grossly intact, nares w/o erythema or drainage Eyes: Conjunctiva clear. Sclera non-icteric Neck: Supple.  Trachea midline Pulmonary:  Good air movement, no use of accessory muscles.  Cardiac: RRR, no JVD Vascular:  Vessel Right Left  Radial Palpable Palpable       Musculoskeletal: M/S 5/5 throughout.  No deformity or atrophy. No significant LE edema. Neurologic: Sensation grossly intact in extremities.  Symmetrical.  Speech is fluent.  Psychiatric: Judgment intact, Mood & affect appropriate for pt's clinical situation. Dermatologic: No rashes or ulcers noted.  No cellulitis or open wounds.      Labs Recent Results (from the past 2160 hour(s))  Comprehensive metabolic panel     Status: Abnormal   Collection Time: 05/02/22  9:26 AM  Result Value Ref Range   Glucose 112 (H) 70 - 99 mg/dL   BUN 17 8 - 27 mg/dL   Creatinine, Ser 0.72 0.57 - 1.00 mg/dL   eGFR 92 >59 mL/min/1.73   BUN/Creatinine Ratio 24 12 - 28   Sodium 143 134 - 144 mmol/L   Potassium 4.5 3.5 - 5.2 mmol/L   Chloride 104 96 - 106 mmol/L   CO2 24 20 - 29 mmol/L   Calcium 10.3 8.7 - 10.3 mg/dL   Total Protein 6.9 6.0 - 8.5 g/dL   Albumin 4.7 3.9 - 4.9 g/dL    Comment:               **Please note reference interval change**   Globulin, Total 2.2 1.5 - 4.5 g/dL   Albumin/Globulin Ratio 2.1 1.2 - 2.2   Bilirubin Total 0.4 0.0  - 1.2 mg/dL   Alkaline Phosphatase 67 44 - 121 IU/L   AST 20 0 - 40 IU/L   ALT 13 0 - 32 IU/L  Lipid panel     Status: Abnormal   Collection Time: 05/02/22  9:26 AM  Result Value Ref Range   Cholesterol, Total 181 100 - 199 mg/dL   Triglycerides 135 0 - 149 mg/dL   HDL 42 >  39 mg/dL   VLDL Cholesterol Cal 24 5 - 40 mg/dL   LDL Chol Calc (NIH) 115 (H) 0 - 99 mg/dL   Chol/HDL Ratio 4.3 0.0 - 4.4 ratio    Comment:                                   T. Chol/HDL Ratio                                             Men  Women                               1/2 Avg.Risk  3.4    3.3                                   Avg.Risk  5.0    4.4                                2X Avg.Risk  9.6    7.1                                3X Avg.Risk 23.4   11.0     Radiology CT ANGIO NECK W OR WO CONTRAST  Result Date: 05/03/2022 CLINICAL DATA:  Carotid stenosis. EXAM: CT ANGIOGRAPHY NECK TECHNIQUE: Multidetector CT imaging of the neck was performed using the standard protocol during bolus administration of intravenous contrast. Multiplanar CT image reconstructions and MIPs were obtained to evaluate the vascular anatomy. Carotid stenosis measurements (when applicable) are obtained utilizing NASCET criteria, using the distal internal carotid diameter as the denominator. RADIATION DOSE REDUCTION: This exam was performed according to the departmental dose-optimization program which includes automated exposure control, adjustment of the mA and/or kV according to patient size and/or use of iterative reconstruction technique. CONTRAST:  67m OMNIPAQUE IOHEXOL 350 MG/ML SOLN COMPARISON:  Carotid Doppler ultrasound 02/09/2022 FINDINGS: Aortic arch: Standard 3 vessel aortic arch. Patent brachiocephalic and subclavian arteries with a small amount of nonstenotic calcified plaque at the left subclavian origin. Right carotid system: Patent with minimal calcified and soft plaque at the carotid bifurcation and in the proximal ICA. No  evidence of a significant stenosis or dissection. Left carotid system: Patent with soft plaque at the carotid bifurcation. High-grade stenosis of the proximal ICA measuring approximately 80%. Normal appearance of the more distal cervical ICA. Vertebral arteries: Patent without evidence of stenosis, dissection, or significant atherosclerosis. Slightly dominant right vertebral artery. Skeleton: Moderate disc degeneration at C5-6 and C6-7. Asymmetrically advanced right facet arthrosis at C3-4 and C4-5. Other neck: No evidence of cervical lymphadenopathy or mass. Upper chest: Mosaic attenuation in the included portions of the lungs, possibly air trapping given suggestion of mild bronchial wall thickening. IMPRESSION: 1. 80% proximal left ICA stenosis. 2. Minimal right carotid artery atherosclerosis without stenosis. 3. Widely patent vertebral arteries. Electronically Signed   By: ALogan BoresM.D.   On: 05/03/2022 08:35   VAS [place]  Result Date: 04/29/2022 Carotid Arterial Duplex Study Patient Name:  SWiliam Ke Date  of Exam:   04/27/2022 Medical Rec #: [id number]       Accession #:    [id number] Date of Birth: [birth year]        Patient Gender: F Patient Age:   56 years Exam Location:  [facility] Procedure:      VAS US CAROTID Referring Phys: Eulogio Ditch --------------------------------------------------------------------------------  Indications:       Carotid artery disease. Comparison Study:  02/09/2022 Performing Technologist: Almira Coaster RVS  Examination Guidelines: A complete evaluation includes B-mode imaging, spectral Doppler, color Doppler, and power Doppler as needed of all accessible portions of each vessel. Bilateral testing is considered an integral part of a complete examination. Limited examinations for reoccurring indications may be performed as noted.  Right Carotid Findings: +----------+--------+--------+--------+------------------+--------+           PSV cm/sEDV  cm/sStenosisPlaque DescriptionComments +----------+--------+--------+--------+------------------+--------+ CCA Prox  75      17                                         +----------+--------+--------+--------+------------------+--------+ CCA Mid   69      19                                         +----------+--------+--------+--------+------------------+--------+ CCA Distal62      19                                         +----------+--------+--------+--------+------------------+--------+ ICA Prox  49      19                                         +----------+--------+--------+--------+------------------+--------+ ICA Mid   64      27                                         +----------+--------+--------+--------+------------------+--------+ ICA Distal82      34                                         +----------+--------+--------+--------+------------------+--------+ ECA       45      0                                          +----------+--------+--------+--------+------------------+--------+ +----------+--------+-------+--------+-------------------+           PSV cm/sEDV cmsDescribeArm Pressure (mmHG) +----------+--------+-------+--------+-------------------+ Subclavian113     0                                  +----------+--------+-------+--------+-------------------+ +---------+--------+--+--------+--+ VertebralPSV cm/s51EDV cm/s20 +---------+--------+--+--------+--+  Left Carotid Findings: +----------+--------+--------+--------+------------------+--------+           PSV cm/sEDV cm/sStenosisPlaque DescriptionComments +----------+--------+--------+--------+------------------+--------+ CCA Prox  92  19                                         +----------+--------+--------+--------+------------------+--------+ CCA Mid   85      18                                          +----------+--------+--------+--------+------------------+--------+ CCA Distal61      14                                         +----------+--------+--------+--------+------------------+--------+ ICA Prox  712     368                                        +----------+--------+--------+--------+------------------+--------+ ICA Mid   154     37                                         +----------+--------+--------+--------+------------------+--------+ ICA Distal55      17                                         +----------+--------+--------+--------+------------------+--------+ ECA       53      7                                          +----------+--------+--------+--------+------------------+--------+ +----------+--------+--------+--------+-------------------+           PSV cm/sEDV cm/sDescribeArm Pressure (mmHG) +----------+--------+--------+--------+-------------------+ [id number]      0                                   +----------+--------+--------+--------+-------------------+ +---------+--------+--+--------+--+ VertebralPSV cm/s60EDV cm/s18 +---------+--------+--+--------+--+   Summary: Right Carotid: There is no evidence of stenosis in the right ICA. Left Carotid: Velocities in the left ICA are consistent with a 80-99% stenosis. Vertebrals:  Bilateral vertebral arteries demonstrate antegrade flow. Subclavians: Normal flow hemodynamics were seen in bilateral subclavian              arteries. *See table(s) above for measurements and observations.  Electronically signed by [name] MD on 04/29/2022 at 8:32:01 AM.    Final     Assessment/Plan  Carotid stenosis I have independently reviewed her CT angiogram of her neck.  The patient clearly has a high-grade left carotid artery stenosis and I would generally agree with the 80% stenosis described from the official report although it is that for a little worse.  The right side has no significant  disease. I had a long discussion today with the patient about the pathophysiology and natural history of carotid artery disease.  She is on appropriate medical therapy with aspirin, Plavix, and statin agent.  With a high-grade lesion, she would benefit from intervention to reduce her  stroke risk over time.  I discussed the differences between carotid artery stenting and carotid endarterectomy.  Her anatomy appears to be acceptable for either technique.  She has a somewhat high bifurcation although likely not prohibitively high and it is a short segment lesion.  There is very minimal calcification and does not appear particularly tortuous.  After discussions, the patient would like to proceed with left carotid stent placement.  Risks and benefits were discussed and she is agreeable to proceed.  Essential hypertension blood pressure control important in reducing the progression of atherosclerotic disease. On appropriate oral medications.   Mixed hyperlipidemia lipid control important in reducing the progression of atherosclerotic disease. Continue statin therapy     [name], MD  05/13/2022 9:29 AM    This note was created with Dragon medical transcription system.  Any errors from dictation are purely unintentional

## 2022-05-13 NOTE — Assessment & Plan Note (Signed)
I have independently reviewed her CT angiogram of her neck.  The patient clearly has a high-grade left carotid artery stenosis and I would generally agree with the 80% stenosis described from the official report although it is that for a little worse.  The right side has no significant disease. I had a long discussion today with the patient about the pathophysiology and natural history of carotid artery disease.  She is on appropriate medical therapy with aspirin, Plavix, and statin agent.  With a high-grade lesion, she would benefit from intervention to reduce her stroke risk over time.  I discussed the differences between carotid artery stenting and carotid endarterectomy.  Her anatomy appears to be acceptable for either technique.  She has a somewhat high bifurcation although likely not prohibitively high and it is a short segment lesion.  There is very minimal calcification and does not appear particularly tortuous.  After discussions, the patient would like to proceed with left carotid stent placement.  Risks and benefits were discussed and she is agreeable to proceed.

## 2022-05-13 NOTE — Patient Instructions (Signed)
Carotid Angioplasty With Stent Carotid angioplasty with stent is a procedure to open or widen an artery in the neck (carotid artery) that has become narrowed. This is done by inflating a small balloon inside the artery and then placing a small piece of metal that looks like a coil or spring (stent) inside the artery. The stent helps keep the artery open by supporting the artery walls. The carotid arteries supply blood to the brain. When fats, cholesterol, and other materials (plaque) build up in an artery, the artery becomes narrow and can become blocked. This can reduce or block blood flow to certain areas of the brain, which can cause serious health problems, including stroke. Tell a health care provider about: Any allergies you have. All medicines you are taking, including vitamins, herbs, eye drops, creams, and over-the-counter medicines. Any problems you or family members have had with anesthetic medicines. Any blood disorders you have. Any surgeries you have had. Any medical conditions you have. Whether you are pregnant or may be pregnant. What are the risks? Generally, this is a safe procedure. However, problems may occur, including: Infection. Bleeding. Allergic reactions to medicines or dyes. Damage to other structures or organs, or to the carotid artery itself. The carotid artery becoming blocked again. A collection of blood under the skin (hematoma) around the stent site that gets larger. A blood clot in another part of the body. Kidney injury. Stroke. Heart attack. What happens before the procedure? Ask your health care provider about: Changing or stopping your regular medicines. This is especially important if you are taking diabetes medicines or blood thinners. Whether aspirin is recommended before this procedure. Taking over-the-counter medicines, vitamins, herbs, and supplements. Follow instructions from your health care provider about eating or drinking restrictions. Do  not use any products that contain nicotine or tobacco for 4 weeks before the procedure. These products include cigarettes, e-cigarettes, and chewing tobacco. If you need help quitting, ask your health care provider. Ask your health care provider what steps will be taken to help prevent infection. These may include: Removing hair at the surgery site. Washing skin with a germ-killing soap. Taking antibiotic medicine. You may have blood tests and imaging tests done. Plan to have someone take you home from the hospital or clinic. If you will be going home right after the procedure, plan to have someone with you for 24 hours. What happens during the procedure?  An IV will be inserted into one of your veins. You may be given one or more of the following: A medicine to help you relax (sedative). A medicine to numb the area where the catheter will be inserted (local anesthetic). Most commonly, an incision will be made in your groin. In some cases, an incision may be made in your wrist or forearm instead of your groin. A small, thin tube (catheter) will be inserted through your incision, into an artery. The catheter will be threaded upward into your carotid artery. An X-ray machine (fluoroscope) will help your health care provider guide the catheter to the correct place in your artery. Dye will be injected into the catheter and will travel to the narrow or blocked part of your carotid artery. X-ray images will be taken of how the dye flows through your artery. While the images are being taken, you may be given instructions about breathing, swallowing, moving, or talking. A filter (distal protection device) will be inserted into your artery. This will be used to catch plaque that comes loose in your artery  during the procedure. This reduces the risk of plaque moving into your brain. A small balloon will be inserted into your artery. The balloon will be inflated for a few seconds to widen your artery and  will then be removed. The stent will be placed in your artery. A second small balloon will be inserted into your artery and inflated. This expands the stent inside of your artery so that the stent holds up the artery walls. The balloon will then be removed. The catheter and the distal protection device will be removed from your artery. Your incision may be closed with stitches (sutures), skin glue, or adhesive tape. A bandage (dressing) will be placed over your incision. The procedure may vary among health care providers and hospitals. What happens after the procedure? Your blood pressure, heart rate, breathing rate, and blood oxygen level will be monitored until you leave the hospital or clinic. You may continue to receive fluids and medicines through an IV. You may need to have pressure placed on the incision site to prevent bleeding. You will need to keep the area still for a few hours, or as long as directed by your health care provider. If the procedure was done in the groin, you will be instructed not to bend or cross your legs. You may have some pain. Pain medicines will be available to help you. You may have a test that uses sound waves to take pictures (ultrasound) of the carotid artery. This can be compared to future tests to check for changes in the artery. Do not drive for 24 hours. Summary Carotid angioplasty with stent is a procedure to open or widen an artery in the neck (carotid artery) that has become narrowed. The procedure is done to lower the risk of problems that can result from reduced blood flow to the brain, including a stroke. The stent placed inside the artery will help keep the artery open by supporting the artery walls. Follow instructions from your health care provider about taking medicines and about eating and drinking before the procedure. This information is not intended to replace advice given to you by your health care provider. Make sure you discuss any  questions you have with your health care provider. Document Revised: 12/08/2020 Document Reviewed: 12/08/2020 Elsevier Patient Education  [place].

## 2022-05-13 NOTE — Assessment & Plan Note (Signed)
lipid control important in reducing the progression of atherosclerotic disease. Continue statin therapy  

## 2022-05-13 NOTE — Progress Notes (Signed)
Carotid stenosis I have independently reviewed her CT angiogram of her neck.  The patient clearly has a high-grade left carotid artery stenosis and I would generally agree with the 80% stenosis described from the official report although it is that for a little worse.  The right side has no significant disease. I had a long discussion today with the patient about the pathophysiology and natural history of carotid artery disease.  She is on appropriate medical therapy with aspirin, Plavix, and statin agent.  With a high-grade lesion, she would benefit from intervention to reduce her stroke risk over time.  I discussed the differences between carotid artery stenting and carotid endarterectomy.  Her anatomy appears to be acceptable for either technique.  She has a somewhat high bifurcation although likely not prohibitively high and it is a short segment lesion.  There is very minimal calcification and does not appear particularly tortuous.  After discussions, the patient would like to proceed with left carotid stent placement.  Risks and benefits were discussed and she is agreeable to proceed.  Essential hypertension blood pressure control important in reducing the progression of atherosclerotic disease. On appropriate oral medications.   Mixed hyperlipidemia lipid control important in reducing the progression of atherosclerotic disease. Continue statin therapy

## 2022-05-14 ENCOUNTER — Other Ambulatory Visit: Payer: Self-pay | Admitting: Internal Medicine

## 2022-05-14 DIAGNOSIS — E782 Mixed hyperlipidemia: Secondary | ICD-10-CM

## 2022-05-16 ENCOUNTER — Telehealth (INDEPENDENT_AMBULATORY_CARE_PROVIDER_SITE_OTHER): Payer: Self-pay

## 2022-05-16 NOTE — Telephone Encounter (Signed)
Requested medication (s) are due for refill today: no  Requested medication (s) are on the active medication list: yes  Last refill:  01/31/22  Future visit scheduled: yes  Notes to clinic:  Unable to refill per protocol, Rx request is too soon. Last refill was 424/23 for 90 and 1 refill. Routing for review.     Requested Prescriptions  Pending Prescriptions Disp Refills   rosuvastatin (CRESTOR) 5 MG tablet [Pharmacy Med Name: ROSUVASTATIN CALCIUM 5 MG TAB] 90 tablet 1    Sig: Take 1 tablet (5 mg total) by mouth daily.     Cardiovascular:  Antilipid - Statins 2 Failed - 05/14/2022  1:18 AM      Failed - Lipid Panel in normal range within the last 12 months    Cholesterol, Total  Date Value Ref Range Status  05/02/2022 181 100 - 199 mg/dL Final   LDL Chol Calc (NIH)  Date Value Ref Range Status  05/02/2022 115 (H) 0 - 99 mg/dL Final   HDL  Date Value Ref Range Status  05/02/2022 42 >39 mg/dL Final   Triglycerides  Date Value Ref Range Status  05/02/2022 135 0 - 149 mg/dL Final         Passed - Cr in normal range and within 360 days    Creatinine, Ser  Date Value Ref Range Status  05/02/2022 0.72 0.57 - 1.00 mg/dL Final         Passed - Patient is not pregnant      Passed - Valid encounter within last 12 months    Recent Outpatient Visits           2 weeks ago Essential hypertension   [facility] Glean Hess, MD   3 months ago Annual physical exam   [facility] Glean Hess, MD   9 months ago Allergic cough   [facility] Glean Hess, MD

## 2022-05-16 NOTE — Telephone Encounter (Signed)
Spoke with the patient and she is scheduled with Dr. Lucky Cowboy for a left carotid stent placement on 05/18/22 with a 6:45 am arrival time to the MM. Pre-procedure instructions were discussed and patient stated she wrote them down.

## 2022-05-18 ENCOUNTER — Encounter: Payer: Self-pay | Admitting: Certified Registered Nurse Anesthetist

## 2022-05-18 ENCOUNTER — Inpatient Hospital Stay
Admission: RE | Admit: 2022-05-18 | Discharge: 2022-05-19 | DRG: 036 | Disposition: A | Discharge status: Home / Self Care | Payer: Medicare HMO | Attending: Vascular Surgery | Admitting: Vascular Surgery

## 2022-05-18 ENCOUNTER — Encounter
Admission: RE | Disposition: A | Discharge status: Home / Self Care | Payer: Self-pay | Source: Home / Self Care | Attending: Vascular Surgery

## 2022-05-18 ENCOUNTER — Encounter: Payer: Self-pay | Admitting: Vascular Surgery

## 2022-05-18 ENCOUNTER — Other Ambulatory Visit: Payer: Self-pay

## 2022-05-18 DIAGNOSIS — Z808 Family history of malignant neoplasm of other organs or systems: Secondary | ICD-10-CM

## 2022-05-18 DIAGNOSIS — I10 Essential (primary) hypertension: Secondary | ICD-10-CM | POA: Diagnosis present

## 2022-05-18 DIAGNOSIS — Z635 Disruption of family by separation and divorce: Secondary | ICD-10-CM | POA: Diagnosis not present

## 2022-05-18 DIAGNOSIS — E782 Mixed hyperlipidemia: Secondary | ICD-10-CM | POA: Diagnosis present

## 2022-05-18 DIAGNOSIS — R0989 Other specified symptoms and signs involving the circulatory and respiratory systems: Secondary | ICD-10-CM | POA: Diagnosis present

## 2022-05-18 DIAGNOSIS — I2510 Atherosclerotic heart disease of native coronary artery without angina pectoris: Secondary | ICD-10-CM | POA: Diagnosis present

## 2022-05-18 DIAGNOSIS — I6522 Occlusion and stenosis of left carotid artery: Principal | ICD-10-CM | POA: Diagnosis present

## 2022-05-18 DIAGNOSIS — Z85828 Personal history of other malignant neoplasm of skin: Secondary | ICD-10-CM

## 2022-05-18 DIAGNOSIS — Z79899 Other long term (current) drug therapy: Secondary | ICD-10-CM | POA: Diagnosis not present

## 2022-05-18 DIAGNOSIS — Z8249 Family history of ischemic heart disease and other diseases of the circulatory system: Secondary | ICD-10-CM | POA: Diagnosis not present

## 2022-05-18 DIAGNOSIS — Z006 Encounter for examination for normal comparison and control in clinical research program: Secondary | ICD-10-CM

## 2022-05-18 HISTORY — PX: CAROTID PTA/STENT INTERVENTION: CATH118231

## 2022-05-18 LAB — GLUCOSE, CAPILLARY: Glucose-Capillary: 84 mg/dL (ref 70–99)

## 2022-05-18 LAB — POCT ACTIVATED CLOTTING TIME: Activated Clotting Time: 371 seconds

## 2022-05-18 LAB — MRSA NEXT GEN BY PCR, NASAL: MRSA by PCR Next Gen: NOT DETECTED

## 2022-05-18 LAB — CREATININE, SERUM
Creatinine, Ser: 0.69 mg/dL (ref 0.44–1.00)
GFR, Estimated: >60 mL/min (ref >60)

## 2022-05-18 LAB — BUN: BUN: 17 mg/dL (ref 8–23)

## 2022-05-18 SURGERY — CAROTID PTA/STENT INTERVENTION
Anesthesia: Moderate Sedation | Laterality: Left

## 2022-05-18 MED ORDER — MIDAZOLAM HCL 2 MG/ML PO SYRP: 8.0000 mg | ORAL_SOLUTION | Freq: Once | ORAL | Status: DC | PRN

## 2022-05-18 MED ORDER — IODIXANOL 320 MG/ML IV SOLN
INTRAVENOUS | Status: DC | PRN
  Administered 2022-05-18: 40 mL via INTRA_ARTERIAL

## 2022-05-18 MED ORDER — OXYCODONE-ACETAMINOPHEN 5-325 MG PO TABS: 1.0000 | ORAL_TABLET | ORAL | Status: DC | PRN

## 2022-05-18 MED ORDER — FAMOTIDINE IN NACL 20-0.9 MG/50ML-% IV SOLN
20.0000 mg | Freq: Two times a day (BID) | INTRAVENOUS | Status: DC
Start: 2022-05-18 — End: 2022-05-19
  Administered 2022-05-18 (×2): 20 mg via INTRAVENOUS
  Filled 2022-05-18 (×2): qty 50

## 2022-05-18 MED ORDER — LORATADINE 10 MG PO TABS
10.0000 mg | ORAL_TABLET | Freq: Every day | ORAL | Status: DC
  Administered 2022-05-18 – 2022-05-19 (×2): 10 mg via ORAL
  Filled 2022-05-18 (×2): qty 1

## 2022-05-18 MED ORDER — DOPAMINE-DEXTROSE 3.2-5 MG/ML-% IV SOLN
INTRAVENOUS | Status: AC
  Filled 2022-05-18: qty 250

## 2022-05-18 MED ORDER — CEFAZOLIN SODIUM-DEXTROSE 2-4 GM/100ML-% IV SOLN: 2.0000 g | INTRAVENOUS | Status: DC

## 2022-05-18 MED ORDER — CEFAZOLIN SODIUM-DEXTROSE 1-4 GM/50ML-% IV SOLN
INTRAVENOUS | Status: DC | PRN
  Administered 2022-05-18: 2 g via INTRAVENOUS

## 2022-05-18 MED ORDER — ALUM & MAG HYDROXIDE-SIMETH 200-200-20 MG/5ML PO SUSP: 15.0000 mL | ORAL | Status: DC | PRN

## 2022-05-18 MED ORDER — ASPIRIN 81 MG PO TBEC
81.0000 mg | DELAYED_RELEASE_TABLET | Freq: Every day | ORAL | Status: DC
  Administered 2022-05-19: 81 mg via ORAL
  Filled 2022-05-18: qty 1

## 2022-05-18 MED ORDER — ACETAMINOPHEN 325 MG RE SUPP: 325.0000 mg | RECTAL | Status: DC | PRN

## 2022-05-18 MED ORDER — FLUTICASONE PROPIONATE 50 MCG/ACT NA SUSP: 2.0000 | Freq: Every day | NASAL | Status: DC | PRN

## 2022-05-18 MED ORDER — METOPROLOL TARTRATE 5 MG/5ML IV SOLN: 2.0000 mg | INTRAVENOUS | Status: DC | PRN

## 2022-05-18 MED ORDER — MIDAZOLAM HCL 2 MG/2ML IJ SOLN
INTRAMUSCULAR | Status: DC
Start: 2022-05-18 — End: 2022-05-18
  Filled 2022-05-18: qty 2

## 2022-05-18 MED ORDER — MORPHINE SULFATE (PF) 4 MG/ML IV SOLN: 2.0000 mg | INTRAVENOUS | Status: DC | PRN

## 2022-05-18 MED ORDER — ORAL CARE MOUTH RINSE
15.0000 mL | OROMUCOSAL | Status: DC | PRN
Start: 2022-05-18 — End: 2022-05-19

## 2022-05-18 MED ORDER — HEPARIN SODIUM (PORCINE) 1000 UNIT/ML IJ SOLN
INTRAMUSCULAR | Status: AC
  Filled 2022-05-18: qty 10

## 2022-05-18 MED ORDER — ONDANSETRON HCL 4 MG/2ML IJ SOLN: 4.0000 mg | Freq: Four times a day (QID) | INTRAMUSCULAR | Status: DC | PRN

## 2022-05-18 MED ORDER — ATROPINE SULFATE 1 MG/10ML IJ SOSY
PREFILLED_SYRINGE | INTRAMUSCULAR | Status: AC
  Filled 2022-05-18: qty 10

## 2022-05-18 MED ORDER — HEPARIN SODIUM (PORCINE) 1000 UNIT/ML IJ SOLN
INTRAMUSCULAR | Status: DC | PRN
  Administered 2022-05-18: 8000 [IU] via INTRAVENOUS

## 2022-05-18 MED ORDER — CLOPIDOGREL BISULFATE 75 MG PO TABS
75.0000 mg | ORAL_TABLET | Freq: Every day | ORAL | Status: DC
  Administered 2022-05-19: 75 mg via ORAL
  Filled 2022-05-18: qty 1

## 2022-05-18 MED ORDER — SODIUM CHLORIDE 0.9 % IV SOLN
INTRAVENOUS | Status: DC
Start: 2022-05-18 — End: 2022-05-19

## 2022-05-18 MED ORDER — HYDRALAZINE HCL 20 MG/ML IJ SOLN: 5.0000 mg | INTRAMUSCULAR | Status: DC | PRN

## 2022-05-18 MED ORDER — HYDROMORPHONE HCL 1 MG/ML IJ SOLN: 1.0000 mg | Freq: Once | INTRAMUSCULAR | Status: DC | PRN

## 2022-05-18 MED ORDER — PHENOL 1.4 % MT LIQD: 1.0000 | OROMUCOSAL | Status: DC | PRN

## 2022-05-18 MED ORDER — POTASSIUM CHLORIDE CRYS ER 20 MEQ PO TBCR: 20.0000 meq | EXTENDED_RELEASE_TABLET | Freq: Every day | ORAL | Status: DC | PRN

## 2022-05-18 MED ORDER — ATROPINE SULFATE 1 MG/10ML IJ SOSY
PREFILLED_SYRINGE | INTRAMUSCULAR | Status: DC | PRN
  Administered 2022-05-18: 1 mg via INTRAVENOUS

## 2022-05-18 MED ORDER — FAMOTIDINE 20 MG PO TABS: 40.0000 mg | ORAL_TABLET | Freq: Once | ORAL | Status: DC | PRN

## 2022-05-18 MED ORDER — SODIUM CHLORIDE 0.9 % IV SOLN: INTRAVENOUS | Status: DC

## 2022-05-18 MED ORDER — CEFAZOLIN SODIUM-DEXTROSE 2-4 GM/100ML-% IV SOLN
INTRAVENOUS | Status: AC
  Filled 2022-05-18: qty 100

## 2022-05-18 MED ORDER — MAGNESIUM SULFATE 2 GM/50ML IV SOLN: 2.0000 g | Freq: Every day | INTRAVENOUS | Status: DC | PRN

## 2022-05-18 MED ORDER — FENTANYL CITRATE (PF) 100 MCG/2ML IJ SOLN
INTRAMUSCULAR | Status: DC | PRN
  Administered 2022-05-18: 50 ug via INTRAVENOUS

## 2022-05-18 MED ORDER — GUAIFENESIN-DM 100-10 MG/5ML PO SYRP: 15.0000 mL | ORAL_SOLUTION | ORAL | Status: DC | PRN

## 2022-05-18 MED ORDER — METHYLPREDNISOLONE SODIUM SUCC 125 MG IJ SOLR: 125.0000 mg | Freq: Once | INTRAMUSCULAR | Status: DC | PRN

## 2022-05-18 MED ORDER — CEFAZOLIN SODIUM-DEXTROSE 2-4 GM/100ML-% IV SOLN
2.0000 g | Freq: Three times a day (TID) | INTRAVENOUS | Status: AC
  Administered 2022-05-18 – 2022-05-19 (×2): 2 g via INTRAVENOUS
  Filled 2022-05-18 (×2): qty 100

## 2022-05-18 MED ORDER — SODIUM CHLORIDE 0.9 % IV SOLN
500.0000 mL | Freq: Once | INTRAVENOUS | Status: AC | PRN
  Administered 2022-05-18: 500 mL via INTRAVENOUS

## 2022-05-18 MED ORDER — LABETALOL HCL 5 MG/ML IV SOLN: 10.0000 mg | INTRAVENOUS | Status: DC | PRN

## 2022-05-18 MED ORDER — FENTANYL CITRATE (PF) 100 MCG/2ML IJ SOLN
INTRAMUSCULAR | Status: AC
  Filled 2022-05-18: qty 2

## 2022-05-18 MED ORDER — MONTELUKAST SODIUM 10 MG PO TABS
10.0000 mg | ORAL_TABLET | Freq: Every day | ORAL | Status: DC
  Filled 2022-05-18: qty 1

## 2022-05-18 MED ORDER — PHENYLEPHRINE HCL-NACL 20-0.9 MG/250ML-% IV SOLN
INTRAVENOUS | Status: AC
  Filled 2022-05-18: qty 250

## 2022-05-18 MED ORDER — HYDROCHLOROTHIAZIDE 25 MG PO TABS
25.0000 mg | ORAL_TABLET | Freq: Every day | ORAL | Status: DC
  Administered 2022-05-19: 25 mg via ORAL
  Filled 2022-05-18: qty 1

## 2022-05-18 MED ORDER — ONDANSETRON HCL 4 MG/2ML IJ SOLN
4.0000 mg | Freq: Four times a day (QID) | INTRAMUSCULAR | Status: DC | PRN
Start: 2022-05-18 — End: 2022-05-19

## 2022-05-18 MED ORDER — DIPHENHYDRAMINE HCL 50 MG/ML IJ SOLN: 50.0000 mg | Freq: Once | INTRAMUSCULAR | Status: DC | PRN

## 2022-05-18 MED ORDER — ACETAMINOPHEN 325 MG PO TABS
325.0000 mg | ORAL_TABLET | ORAL | Status: DC | PRN
  Administered 2022-05-18: 650 mg via ORAL
  Filled 2022-05-18: qty 2

## 2022-05-18 MED ORDER — CHLORHEXIDINE GLUCONATE CLOTH 2 % EX PADS
6.0000 | MEDICATED_PAD | Freq: Every day | CUTANEOUS | Status: DC
Start: 2022-05-18 — End: 2022-05-19
  Administered 2022-05-18: 6 via TOPICAL

## 2022-05-18 MED ORDER — MIDAZOLAM HCL 2 MG/2ML IJ SOLN
INTRAMUSCULAR | Status: AC
  Filled 2022-05-18: qty 2

## 2022-05-18 MED ORDER — PHENYLEPHRINE 80 MCG/ML (10ML) SYRINGE FOR IV PUSH (FOR BLOOD PRESSURE SUPPORT)
PREFILLED_SYRINGE | INTRAVENOUS | Status: AC
  Filled 2022-05-18: qty 10

## 2022-05-18 MED ORDER — MIDAZOLAM HCL 2 MG/2ML IJ SOLN
INTRAMUSCULAR | Status: DC | PRN
  Administered 2022-05-18: 2 mg via INTRAVENOUS

## 2022-05-18 MED ORDER — ROSUVASTATIN CALCIUM 10 MG PO TABS: 5.0000 mg | ORAL_TABLET | Freq: Every evening | ORAL | Status: DC

## 2022-05-18 SURGICAL SUPPLY — 20 items
BALLN VTRAC 4.5X20X135 (BALLOONS) ×2
BALLOON VTRAC 4.5X20X135 (BALLOONS) IMPLANT
CATH ANGIO 5F PIGTAIL 100CM (CATHETERS) ×1 IMPLANT
CATH BEACON 5 .035 100 H1 TIP (CATHETERS) ×1 IMPLANT
COVER DRAPE FLUORO 36X44 (DRAPES) ×1 IMPLANT
COVER PROBE U/S 5X48 (MISCELLANEOUS) ×1 IMPLANT
DEVICE EMBOSHIELD NAV6 4.0-7.0 (FILTER) ×1 IMPLANT
DEVICE SAFEGUARD 24CM (GAUZE/BANDAGES/DRESSINGS) ×1 IMPLANT
DEVICE STARCLOSE SE CLOSURE (Vascular Products) ×1 IMPLANT
DEVICE TORQUE .025-.038 (MISCELLANEOUS) ×1 IMPLANT
GLIDEWIRE ANGLED SS 035X260CM (WIRE) ×1 IMPLANT
GUIDEWIRE VASC STIFF .038X260 (WIRE) ×1 IMPLANT
KIT CAROTID MANIFOLD (MISCELLANEOUS) ×1 IMPLANT
KIT ENCORE 26 ADVANTAGE (KITS) ×1 IMPLANT
PACK ANGIOGRAPHY (CUSTOM PROCEDURE TRAY) ×2 IMPLANT
SHEATH BRITE TIP 6FRX11 (SHEATH) ×1 IMPLANT
SHEATH SHUTTLE 6FRX80 (SHEATH) ×1 IMPLANT
STENT XACT CAR 9-7X30X136 (Permanent Stent) ×1 IMPLANT
SYR MEDRAD MARK 7 150ML (SYRINGE) ×1 IMPLANT
WIRE GUIDERIGHT .035X150 (WIRE) ×1 IMPLANT

## 2022-05-18 NOTE — Interval H&P Note (Signed)
History and Physical Interval Note:  05/18/2022 8:12 AM  Heather Dean  has presented today for surgery, with the diagnosis of L Carotid Stent Placement   ABBOTT   Carotid artery stenosis.  The various methods of treatment have been discussed with the patient and family. After consideration of risks, benefits and other options for treatment, the patient has consented to  Procedure(s): CAROTID PTA/STENT INTERVENTION (Left) as a surgical intervention.  The patient's history has been reviewed, patient examined, no change in status, stable for surgery.  I have reviewed the patient's chart and labs.  Questions were answered to the patient's satisfaction.     [name]

## 2022-05-18 NOTE — Op Note (Signed)
OPERATIVE NOTE DATE: 05/18/2022  PROCEDURE:  Ultrasound guidance for vascular access right femoral artery  Placement of a 9 mm proximal, 7 mm distal, 3 cm long exact stent with the use of the NAV-6 embolic protection device in the left carotid artery  PRE-OPERATIVE DIAGNOSIS: 1.  High-grade left carotid artery stenosis.   POST-OPERATIVE DIAGNOSIS:  Same as above  SURGEON: [name], MD  ASSISTANT(S): None  ANESTHESIA: local/MCS  ESTIMATED BLOOD LOSS: 20 cc  CONTRAST: 40 cc  FLUORO TIME: 3.7 minutes  MODERATE CONSCIOUS SEDATION TIME:  Approximately 37 minutes using 2 mg of Versed and 50 mcg of Fentanyl  FINDING(S): 1.   90-95% left internal carotid artery stenosis  SPECIMEN(S):   none  INDICATIONS:   Patient is a 66 y.o. female who presents with left carotid artery stenosis.  The patient has good anatomy and carotid artery stenting was felt to be preferred to endarterectomy for that reason.  Risks and benefits were discussed and informed consent was obtained.   DESCRIPTION: After obtaining full informed written consent, the patient was brought back to the vascular suite and placed supine upon the table.  The patient received IV antibiotics prior to induction. Moderate conscious sedation was administered during a face to face encounter with the patient throughout the procedure with my supervision of the RN administering medicines and monitoring the patients vital signs and mental status throughout from the start of the procedure until the patient was taken to the recovery room.  After obtaining adequate anesthesia, the patient was prepped and draped in the standard fashion.   The right femoral artery was visualized with ultrasound and found to be widely patent. It was then accessed under direct ultrasound guidance without difficulty with a Seldinger needle. A permanent image was recorded. A J-wire was placed and we then placed a 6 French sheath. The patient was then heparinized  and a total of 8000 units of intravenous heparin were given and an ACT was checked to confirm successful anticoagulation. A pigtail catheter was then placed into the ascending aorta. This showed a type I aortic arch without proximal stenosis. I then selectively cannulated the left common carotid artery without difficulty with a headhunter catheter and advanced into the mid left common carotid artery.  Cervical and cerebral carotid angiography was then performed. There were no obvious intracranial filling defects with slightly sluggish flow particularly in the anterior cerebral artery. The carotid bifurcation demonstrated a very high-grade stenosis just beyond the origin of the left internal carotid artery in the 90 to 95% range.  I then advanced into the external carotid artery with a Glidewire and the headhunter catheter and then exchanged for the Amplatz Super Stiff wire. Over the Amplatz Super Stiff wire, a 6 [place] shuttle sheath was placed into the mid common carotid artery. I then used the NAV-6  Embolic protection device and crossed the lesion and parked this in the distal internal carotid artery at the base of the skull.  I then selected a 9 mm proximal, 7 mm distal, 3 cm long exact stent. This was deployed across the lesion encompassing it in its entirety. A 4.5 mm diameter by 2 cm length balloon was used to post dilate the stent. Only about a 10% residual stenosis was present after angioplasty. Completion angiogram showed normal intracranial filling without new defects. At this point I elected to terminate the procedure. The sheath was removed and StarClose closure device was deployed in the right femoral artery with excellent hemostatic result.  The patient was taken to the recovery room in stable condition having tolerated the procedure well.  COMPLICATIONS: none  CONDITION: stable  [name] 05/18/2022 9:21 AM   This note was created with Dragon Medical transcription system. Any errors in  dictation are purely unintentional.

## 2022-05-19 DIAGNOSIS — Z9889 Other specified postprocedural states: Secondary | ICD-10-CM

## 2022-05-19 DIAGNOSIS — Z95828 Presence of other vascular implants and grafts: Secondary | ICD-10-CM

## 2022-05-19 LAB — CBC
HCT: 36.2 % (ref 36.0–46.0)
Hemoglobin: 12.2 g/dL (ref 12.0–15.0)
MCH: 28 pg (ref 26.0–34.0)
MCHC: 33.7 g/dL (ref 30.0–36.0)
MCV: 83 fL (ref 80.0–100.0)
Platelets: 220 10*3/uL (ref 150–400)
RBC: 4.36 MIL/uL (ref 3.87–5.11)
RDW: 13.4 % (ref 11.5–15.5)
WBC: 6.3 10*3/uL (ref 4.0–10.5)
nRBC: 0 % (ref 0.0–0.2)

## 2022-05-19 LAB — BASIC METABOLIC PANEL
Anion gap: 6 (ref 5–15)
BUN: 9 mg/dL (ref 8–23)
CO2: 25 mmol/L (ref 22–32)
Calcium: 8.6 mg/dL — ABNORMAL LOW (ref 8.9–10.3)
Chloride: 108 mmol/L (ref 98–111)
Creatinine, Ser: 0.52 mg/dL (ref 0.44–1.00)
GFR, Estimated: >60 mL/min (ref >60)
Glucose, Bld: 97 mg/dL (ref 70–99)
Potassium: 3.3 mmol/L — ABNORMAL LOW (ref 3.5–5.1)
Sodium: 139 mmol/L (ref 135–145)

## 2022-05-19 MED ORDER — OXYCODONE-ACETAMINOPHEN 5-325 MG PO TABS: 1.0000 | ORAL_TABLET | Freq: Four times a day (QID) | ORAL | 0 refills | Status: DC | PRN

## 2022-05-19 NOTE — Progress Notes (Addendum)
1130 Discharge teaching done. Questions answered.  1145 Discharged home.

## 2022-05-19 NOTE — Discharge Summary (Signed)
[facility]    Discharge Summary    Patient ID:  Heather Dean MRN: [id number] DOB/AGE: [birth year] 66 y.o.  Admit date: 05/18/2022 Discharge date: 05/19/2022 Date of Surgery: 05/18/2022 Surgeon: Surgeon(s): Lucky Cowboy Erskine Squibb, MD  Admission Diagnosis: Carotid stenosis, left [I65.22]  Discharge Diagnoses:  Carotid stenosis, left [I65.22]  Secondary Diagnoses: Past Medical History:  Diagnosis Date   Basal cell carcinoma    Hyperlipidemia    Hypertension    Obesity     Procedure(s): CAROTID PTA/STENT INTERVENTION  Discharged Condition: good  HPI:  Heather Dean is a 66 year old female that presented to [facility] on 05/18/2022 for placement of a left carotid stent.  Prior to intervention it was noted that she had a stenosis in the 90 to 95% range.  Following the procedure the patient had no complications.  Blood pressure is well-controlled.  Currently groin is clean dry and intact with no evidence of hematoma.  The patient is neurologically intact with no signs symptoms of CVA.  Hospital Course:  Heather Dean is a 66 y.o. female is S/P Left Procedure(s): CAROTID PTA/STENT INTERVENTION Extubated: POD # 0 Physical exam: Neurologically intact, groin is clean dry and intact Post-op wounds clean, dry, intact or healing well Pt. Ambulating, voiding and taking PO diet without difficulty. Pt pain controlled with PO pain meds. Labs as below Complications:none  Consults:    Significant Diagnostic Studies: CBC Lab Results  Component Value Date   WBC 6.3 05/19/2022   HGB 12.2 05/19/2022   HCT 36.2 05/19/2022   MCV 83.0 05/19/2022   PLT 220 05/19/2022    BMET    Component Value Date/Time   NA 139 05/19/2022 0613   NA 143 05/02/2022 0926   K 3.3 (L) 05/19/2022 0613   CL 108 05/19/2022 0613   CO2 25 05/19/2022 0613   GLUCOSE 97 05/19/2022 0613   BUN 9 05/19/2022 0613   BUN 17 05/02/2022 0926   CREATININE 0.52 05/19/2022 0613    CALCIUM 8.6 (L) 05/19/2022 0613   GFRNONAA >60 05/19/2022 3810   COAG No results found for: "INR", "PROTIME"   Disposition:  Discharge to :Home  Allergies as of 05/19/2022   No Known Allergies      Medication List     STOP taking these medications    fexofenadine 180 MG tablet Commonly known as: ALLEGRA   loratadine 10 MG tablet Commonly known as: Claritin       TAKE these medications    aspirin 81 MG chewable tablet Chew 81 mg by mouth daily.   clopidogrel 75 MG tablet Commonly known as: PLAVIX Take 1 tablet (75 mg total) by mouth daily.   fluticasone 50 MCG/ACT nasal spray Commonly known as: FLONASE Place 2 sprays into both nostrils daily.   hydrochlorothiazide 25 MG tablet Commonly known as: HYDRODIURIL Take 1 tablet (25 mg total) by mouth daily.   montelukast 10 MG tablet Commonly known as: SINGULAIR TAKE 1 TABLET BY MOUTH EVERYDAY AT BEDTIME   MULTIVITAMIN ADULT PO Take by mouth.   oxyCODONE-acetaminophen 5-325 MG tablet Commonly known as: PERCOCET/ROXICET Take 1 tablet by mouth every 6 (six) hours as needed for moderate pain.   rosuvastatin 5 MG tablet Commonly known as: Crestor Take 1 tablet (5 mg total) by mouth daily.       Verbal and written Discharge instructions given to the patient. Wound care per Discharge AVS  Follow-up Information     Kris Hartmann, NP Follow up  in 4 week(s).   Specialty: Vascular Surgery Why: See JD/FB with Carotid in 4 weeks Contact information: [place] [address] [phone]                 Signed: Kris Hartmann, NP  05/19/2022, 10:29 AM

## 2022-05-19 NOTE — Progress Notes (Signed)
 Vein and Vascular Surgery  Daily Progress Note   Subjective  -   No events overnight. Doing well. Wants to go home  Objective Vitals:   05/19/22 0600 05/19/22 0630 05/19/22 0700 05/19/22 0800  BP: (!) 118/59 (!) 93/57  136/77  Pulse: 66 63 64 69  Resp: (!) 22 18 (!) 22 18  Temp:    98.1 F (36.7 C)  TempSrc:      SpO2: 100% 97% 96% 94%  Weight:      Height:        Intake/Output Summary (Last 24 hours) at 05/19/2022 0852 Last data filed at 05/19/2022 0600 Gross per 24 hour  Intake 2108.27 ml  Output 2050 ml  Net 58.27 ml    PULM  CTAB CV  RRR VASC  Neuro exam intact. Access site without hematoma  Laboratory CBC    Component Value Date/Time   WBC 6.3 05/19/2022 0613   HGB 12.2 05/19/2022 0613   HGB 15.5 01/28/2022 0913   HCT 36.2 05/19/2022 0613   HCT 45.5 01/28/2022 0913   PLT 220 05/19/2022 0613   PLT 331 01/28/2022 0913    BMET    Component Value Date/Time   NA 139 05/19/2022 0613   NA 143 05/02/2022 0926   K 3.3 (L) 05/19/2022 0613   CL 108 05/19/2022 0613   CO2 25 05/19/2022 0613   GLUCOSE 97 05/19/2022 0613   BUN 9 05/19/2022 0613   BUN 17 05/02/2022 0926   CREATININE 0.52 05/19/2022 0613   CALCIUM 8.6 (L) 05/19/2022 0613   GFRNONAA >60 05/19/2022 4287    Assessment/Planning: POD #1 s/p left carotid stent for high grade stenosis  Doing well Plan discharge home today on ASA/Plavix/statin agent RTC 3 weeks in office   [facility]  05/19/2022, 8:52 AM

## 2022-05-21 DIAGNOSIS — Z87898 Personal history of other specified conditions: Secondary | ICD-10-CM | POA: Insufficient documentation

## 2022-05-21 DIAGNOSIS — R569 Unspecified convulsions: Secondary | ICD-10-CM | POA: Insufficient documentation

## 2022-05-21 DIAGNOSIS — I610 Nontraumatic intracerebral hemorrhage in hemisphere, subcortical: Secondary | ICD-10-CM | POA: Insufficient documentation

## 2022-05-23 NOTE — Interval H&P Note (Signed)
History and Physical Interval Note:  05/23/2022 8:08 AM  Heather Dean  has presented today for surgery, with the diagnosis of L Carotid Stent Placement   ABBOTT   Carotid artery stenosis.  The various methods of treatment have been discussed with the patient and family. After consideration of risks, benefits and other options for treatment, the patient has consented to  Procedure(s): CAROTID PTA/STENT INTERVENTION (Left) as a surgical intervention.  The patient's history has been reviewed, patient examined, no change in status, stable for surgery.  I have reviewed the patient's chart and labs.  Questions were answered to the patient's satisfaction.     [name]

## 2022-06-01 ENCOUNTER — Encounter: Payer: Self-pay | Admitting: Internal Medicine

## 2022-06-01 ENCOUNTER — Ambulatory Visit (INDEPENDENT_AMBULATORY_CARE_PROVIDER_SITE_OTHER): Payer: Medicare HMO | Admitting: Internal Medicine

## 2022-06-01 VITALS — BP 122/80 | HR 85 | Ht 62.0 in | Wt 157.0 lb

## 2022-06-01 DIAGNOSIS — R569 Unspecified convulsions: Secondary | ICD-10-CM

## 2022-06-01 DIAGNOSIS — I10 Essential (primary) hypertension: Secondary | ICD-10-CM

## 2022-06-01 DIAGNOSIS — I610 Nontraumatic intracerebral hemorrhage in hemisphere, subcortical: Secondary | ICD-10-CM

## 2022-06-01 DIAGNOSIS — E782 Mixed hyperlipidemia: Secondary | ICD-10-CM | POA: Diagnosis not present

## 2022-06-01 MED ORDER — ROSUVASTATIN CALCIUM 10 MG PO TABS: 10.0000 mg | ORAL_TABLET | Freq: Every day | ORAL | 1 refills | Status: DC

## 2022-06-01 NOTE — Progress Notes (Signed)
Date:  06/01/2022   Name:  Heather Dean   DOB:  1955-12-21   MRN:  [id number]   Chief Complaint: Hospitalization Follow-up (Not feeling the best, still having headaches, mostly on the left side ) Hospital follow up.  Admitted to [facility]  05/21/22 to 05/31/22 for cognitive deficit and possible CVA.  TOC call not as patient is being seen today. She is doing well.  Still having some mild headaches that respond to tylenol.  PT has contacted her and will start in the next few days. She is tolerating her medications, no bleeding or other side effects. No residual or new neurological deficits.  Hospital course:  8/12 Admit 8/13 Repeat CTH stable. Stopped clevidipine 2241 8/14: MRI and EEG completed 8/17: continues to endorse HA, better with lyrica; BP somewhat labile overnight 8/18: ASA restarted; HA overnight with some HTN 8/19: repeat CTH for HA-> unchanged. HA improving with increased lyrica and tylenol. 8/20: HA stable. 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 Hyperperfusion syndrome s/p CEA with  # left BG ICH  - S/P Lt carotid stent 8/9 @ [facility] - ASA 874mand Plavix - Initially resumed 8/13 - Plavix held as of 8/17 due to slight SAH (likely reperfusion injury) - Plavix resumed 8/20 - CTA (8/12) demonstrated stent and ICA patent with findings associated w/ hyperemia  - stable to transfer to floor  # ? If reversible cerebral vasoconstriction syndrome? But more likely reperfusion issue  h/a, SAH, sz stroke though not classic on CTA  # SAH  Symptom Onset: Time: 1500 Date: 05/21/22 Coagulopathy present No; however, on ASA and Plavix for recent Lt carotid stent 3d prior on 3/9 Reversal  agent indicated No  - neuro checks: q4hr  - Blood pressure goals: SBP < 130 mmHg given reperfusion injury  - trying to balance relative hypotension and risk for hypoperfusion  - Pt with HA when BP goes above 130s  - Restart [facility] Rosuvastatin 19m31mo qhs - Lipid panel (8/12) Tchol: 164, LDL 107, HDL 46, trig 57 - MRI (8/15) concern for new SAH overlying Lt frontal lobe       IMPRESSION:  1. Patchy areas of cortical diffusion restriction in the left frontal and  parietal lobes, with surrounding cortical and subcortical T2/FLAIR  hyperintensity, as well as a small area of subarachnoid hemorrhage  overlying the left frontal lobe. There is also evidence for dilation and  slow flow within the left MCA distal branches. In the context of recent  carotid endarterectomy, this is suspected to relate to dysfunctional  autoregulation such as can be seen with cerebral hyperperfusion syndrome.  2. Stable size of left lentiform nucleus intraparenchymal hematoma, which  is potentially also secondary to cerebral hyperperfusion physiology.   - Felt more likely related to reperfusion injury  - Did have incidental note of R clinoid ICA [id number] but felt that SAH [facility] more likely related to reperfusion given L hemisphere and clinical picture - Discussed with NSGY, patient to see Dr [facility]omo[facility]ancesca [facility] in one month for follow up; no acute intervention - restarted ASA overnight (8/17)  - Balancing risk for in-stent thrombosis with slight worsening of SAH; pt in agreement with plan - Restart plavix 8/20 Patient neurologically  stable to transfer to floor. Would watch for another day or two to ensure bp is not too low on current dose but she does not spike higher. Will d/c home on aspirin and plavix.  Dr. Bosie Helper accepted patient, but will also plan for discharge in the next day or two if unable to get a floor bed.   # Seizure - Continue Keppra 1gm iv q12h for Sz prophylaxis - Likely related to hyperemia  and reperfusion injury - EEG wnl - MRI brain w/wo 8/15 c/f reperfusion injury - Seizure restrictions/precautions discussed with patient. Discussed state regulations regarding driving restrictions and the need to be free of seizures (that would impair the ability to operate a vehicle safely) for at least six months before returning to driving. The patient is aware that it is their responsibility to notify the [facility] and to not drive until approved.   # Analgesia, HA unclear if headache is positional but only when in trendelenberg.  - APAP 654m po q6hPRN occasional  - Lyrica 1046mBID    Medications at Time of Discharge - documented as of this encounter Medications at Time of Discharge Medication Sig Dispensed Refills Start Date End Date  levETIRAcetam (KEPPRA) 1000 MG tablet   Take 1 tablet (1,000 mg total) by mouth every 12 (twelve) hours 60 tablet   11 05/31/2022 05/31/2023  lisinopriL (ZESTRIL) 20 MG tablet   Take 1 tablet (20 mg total) by mouth once daily 30 tablet   11 06/01/2022 06/01/2023  pregabalin (LYRICA) 100 MG capsule   Take 1 capsule (100 mg total) by mouth every 12 (twelve) hours 60 capsule   11 05/31/2022 05/31/2023  aspirin 81 MG chewable tablet   Take 81 mg by mouth once daily   0      cholecalciferol (VITAMIN D3) 1,000 unit capsule   Take 1,000 Units by mouth once daily. Reported on 02/25/2016   0      clopidogreL (PLAVIX) 75 mg tablet   Take 75 mg by mouth once daily   0      fluticasone (FLONASE) 50 mcg/actuation nasal spray   Place 2 sprays into both nostrils once daily as needed for Rhinitis. Reported on 02/25/2016   0      montelukast (SINGULAIR) 10 mg tablet   Take 10 mg by mouth at bedtime   0      rosuvastatin (CRESTOR) 10 MG tablet   Take 10 mg by mouth nightly.   4 01/15/2016     Ordered Prescriptions - documented in this encounter Reconcile with Patient's Chart Ordered Prescriptions Prescription Sig Dispensed Refills Start Date End Date  pregabalin (LYRICA) 100  MG capsule   Take 1 capsule (100 mg total) by mouth every 12 (twelve) hours 60 capsule   11 05/31/2022 05/31/2023  lisinopriL (ZESTRIL) 20 MG tablet   Take 1 tablet (20 mg total) by mouth once daily 30 tablet   11 06/01/2022 06/01/2023  levETIRAcetam (KEPPRA) 1000 MG tablet   Take 1 tablet (1,000 mg total) by mouth every 12 (twelve) hours 60 tablet   11 05/31/2022 05/31/2023  Medication reconciliation performed. HPI  Lab Results  Component Value Date   NA 139 05/19/2022   K 3.3 (L) 05/19/2022   CO2 25 05/19/2022   GLUCOSE 97 05/19/2022   BUN 9 05/19/2022   CREATININE 0.52 05/19/2022   CALCIUM 8.6 (L) 05/19/2022   EGFR 92 05/02/2022   GFRNONAA >60 05/19/2022   Lab Results  Component Value Date   CHOL 181  05/02/2022   HDL 42 05/02/2022   LDLCALC 115 (H) 05/02/2022   TRIG 135 05/02/2022   CHOLHDL 4.3 05/02/2022   Lab Results  Component Value Date   TSH 1.700 01/28/2022   Lab Results  Component Value Date   HGBA1C 5.5 01/28/2022   Lab Results  Component Value Date   WBC 6.3 05/19/2022   HGB 12.2 05/19/2022   HCT 36.2 05/19/2022   MCV 83.0 05/19/2022   PLT 220 05/19/2022   Lab Results  Component Value Date   ALT 13 05/02/2022   AST 20 05/02/2022   ALKPHOS 67 05/02/2022   BILITOT 0.4 05/02/2022   No results found for: "25OHVITD2", "25OHVITD3", "VD25OH"   Review of Systems  Constitutional:  Positive for fatigue. Negative for chills, diaphoresis, fever and unexpected weight change.  Respiratory:  Negative for cough, chest tightness and shortness of breath.   Cardiovascular:  Negative for chest pain and leg swelling.  Neurological:  Positive for headaches. Negative for dizziness, syncope, speech difficulty, weakness and light-headedness.  Psychiatric/Behavioral:  Negative for dysphoric mood and sleep disturbance. The patient is not nervous/anxious.     Patient Active Problem List   Diagnosis Date Noted   Nontraumatic subcortical hemorrhage of left cerebral  hemisphere ([place]) 05/21/2022   Seizure ([place]) 05/21/2022   Carotid stenosis, left 05/18/2022   Mixed hyperlipidemia 01/30/2022   Bruit of right carotid artery 01/28/2022   Abnormal mammogram 01/28/2022   Allergic cough 08/18/2021   Essential hypertension 08/18/2021    No Known Allergies  Past Surgical History:  Procedure Laterality Date   BREAST CYST ASPIRATION Left    BREAST EXCISIONAL BIOPSY Right    CAROTID PTA/STENT INTERVENTION Left 05/18/2022   Procedure: CAROTID PTA/STENT INTERVENTION;  Surgeon: Algernon Huxley, MD;  Location: [facility];  Service: Cardiovascular;  Laterality: Left;   COLONOSCOPY     COLONOSCOPY WITH PROPOFOL N/A 04/01/2016   Procedure: COLONOSCOPY WITH PROPOFOL;  Surgeon: Manya Silvas, MD;  Location[facility] ENDOSCOPY;  Service: Endoscopy;  Laterality: N/A;   TONSILLECTOMY     TUBAL LIGATION      Social History   Tobacco Use   Smoking status: Never   Smokeless tobacco: Never  Vaping Use   Vaping Use: Never used  Substance Use Topics   Alcohol use: Not Currently   Drug use: No     Medication list has been reviewed and updated.  Current Meds  Medication Sig   aspirin 81 MG chewable tablet Chew 81 mg by mouth daily.   Cholecalciferol (VITAMIN D3 PO) Take by mouth daily.   clopidogrel (PLAVIX) 75 MG tablet Take 1 tablet (75 mg total) by mouth daily.   fluticasone (FLONASE) 50 MCG/ACT nasal spray Place 2 sprays into both nostrils daily.   levETIRAcetam (KEPPRA) 1000 MG tablet Take by mouth.   lisinopril (ZESTRIL) 20 MG tablet Take by mouth.   montelukast (SINGULAIR) 10 MG tablet TAKE 1 TABLET BY MOUTH EVERYDAY AT BEDTIME   Multiple Vitamin (MULTIVITAMIN ADULT PO) Take by mouth. Centrum Silver   oxyCODONE-acetaminophen (PERCOCET/ROXICET) 5-325 MG tablet Take 1 tablet by mouth every 6 (six) hours as needed for moderate pain.   pregabalin (LYRICA) 100 MG capsule Take by mouth.   rosuvastatin (CRESTOR) 5 MG tablet Take 1 tablet (5 mg total) by  mouth daily. (Patient taking differently: Take 10 mg by mouth daily.)       06/01/2022    1:57 PM 05/02/2022    8:30 AM 01/28/2022    8:33  AM 08/18/2021    2:57 PM  GAD 7 : Generalized Anxiety Score  Nervous, Anxious, on Edge 0 0 0 0  Control/stop worrying 0 0 0 0  Worry too much - different things 0 0 0 0  Trouble relaxing 0 0 1 0  Restless 1 0 0 0  Easily annoyed or irritable 0 0 0 0  Afraid - awful might happen 0 0 0 0  Total GAD 7 Score 1 0 1 0  Anxiety Difficulty Not difficult at all Not difficult at all  Not difficult at all       06/01/2022    1:55 PM 05/02/2022    8:30 AM 01/28/2022    8:33 AM  Depression screen PHQ 2/9  Decreased Interest 1 0 0  Down, Depressed, Hopeless 0 0 0  PHQ - 2 Score 1 0 0  Altered sleeping 0 2 1  Tired, decreased energy 2 1 0  Change in appetite 0 0 0  Feeling bad or failure about yourself  0 0 0  Trouble concentrating 1 0 0  Moving slowly or fidgety/restless 1 0 0  Suicidal thoughts 0 0 0  PHQ-9 Score '5 3 1  ' Difficult doing work/chores Not difficult at all Not difficult at all Not difficult at all    BP Readings from Last 3 Encounters:  06/01/22 122/80  05/19/22 136/77  05/13/22 (!) 158/82    Physical Exam Vitals and nursing note reviewed.  Constitutional:      General: She is not in acute distress.    Appearance: Normal appearance. She is well-developed.  HENT:     Head: Normocephalic and atraumatic.  Neck:     Vascular: No carotid bruit.  Cardiovascular:     Rate and Rhythm: Normal rate and regular rhythm.     Pulses: Normal pulses.  Pulmonary:     Effort: Pulmonary effort is normal. No respiratory distress.     Breath sounds: No wheezing or rhonchi.  Musculoskeletal:     Cervical back: Normal range of motion.     Right lower leg: No edema.     Left lower leg: No edema.  Lymphadenopathy:     Cervical: No cervical adenopathy.  Skin:    General: Skin is warm and dry.     Findings: No rash.  Neurological:     Mental  Status: She is alert and oriented to person, place, and time.     Cranial Nerves: Cranial nerves 2-12 are intact.     Sensory: Sensation is intact.     Motor: Motor function is intact.     Coordination: Coordination is intact.     Deep Tendon Reflexes:     Reflex Scores:      Patellar reflexes are 2+ on the right side and 2+ on the left side.      Achilles reflexes are 2+ on the right side and 2+ on the left side. Psychiatric:        Attention and Perception: Attention normal.        Mood and Affect: Mood normal.        Behavior: Behavior normal.     Wt Readings from Last 3 Encounters:  06/01/22 157 lb (71.2 kg)  05/18/22 163 lb 5.8 oz (74.1 kg)  05/13/22 158 lb 9.6 oz (71.9 kg)    BP 122/80   Pulse 85   Ht '5\' 2"'  (1.575 m)   Wt 157 lb (71.2 kg)   SpO2 98%   BMI 28.72  kg/m   Assessment and Plan: 1. Essential hypertension Clinically stable exam with well controlled BP - now on lisinopril.  Tolerating medications without side effects at this time. Pt to continue current regimen and low sodium diet; benefits of regular exercise as able discussed.  2. Nontraumatic subcortical hemorrhage of left cerebral hemisphere ([place]) Felt to be due to cerebral reperfusion injury after carotid stent Still having mild headache which responds to Tylenol - she can take this daily as needed Follow up scheduled with Neurosurgery for CT and OV Agree with [facility] PTx  3. Seizure ([place]) Plan to continue Painter for now. No driving. Will consider Neurology referral if not done by Neurosurgery to discuss stopping medications sooner if doing well.  4. Mixed hyperlipidemia Now on higher dose Crestor without side effects so far. Will plan to recheck labs in several months - rosuvastatin (CRESTOR) 10 MG tablet; Take 1 tablet (10 mg total) by mouth daily.  Dispense: 90 tablet; Refill: 1   Partially dictated using Editor, commissioning. Any errors are unintentional.  Halina Maidens, MD [facility]  06/01/2022

## 2022-06-22 ENCOUNTER — Other Ambulatory Visit (INDEPENDENT_AMBULATORY_CARE_PROVIDER_SITE_OTHER): Payer: Self-pay | Admitting: Vascular Surgery

## 2022-06-22 DIAGNOSIS — I6522 Occlusion and stenosis of left carotid artery: Secondary | ICD-10-CM

## 2022-06-27 ENCOUNTER — Encounter (INDEPENDENT_AMBULATORY_CARE_PROVIDER_SITE_OTHER): Payer: Self-pay | Admitting: Nurse Practitioner

## 2022-06-27 ENCOUNTER — Ambulatory Visit (INDEPENDENT_AMBULATORY_CARE_PROVIDER_SITE_OTHER): Payer: Medicare HMO

## 2022-06-27 ENCOUNTER — Ambulatory Visit (INDEPENDENT_AMBULATORY_CARE_PROVIDER_SITE_OTHER): Payer: Medicare HMO | Admitting: Nurse Practitioner

## 2022-06-27 VITALS — BP 114/73 | HR 76 | Resp 16 | Wt 160.6 lb

## 2022-06-27 DIAGNOSIS — I6522 Occlusion and stenosis of left carotid artery: Secondary | ICD-10-CM

## 2022-06-27 NOTE — Progress Notes (Addendum)
Subjective:    Patient ID: [name], female    DOB: 26-Jan-1956, 66 y.o.   MRN: [id number] Chief Complaint  Patient presents with   Follow-up    [facility] 4 week with carotid ultrasound    Leina Babe is a 66 year old female who underwent treatment of a left ICA stenosis on 05/18/2022.  Patient had a carotid artery stent placed.  Immediately following the surgery the patient had no significant difficulties with blood pressure control issues.  Subsequently several days later the patient had a seizure and was found to have swelling and bleeding on her brain because from reperfusion of the carotid stent.  She also had extremely elevated blood pressure initially but there was difficulty with controlling as she will become extremely hypotensive and then hypotensive.  She stayed for several days at the emergency hospital.  Today she does not have any continued residual deficits.  Remains compliant with her medication.  She notes that her blood pressures have also become more stable since discharge.  Noninvasive studies show a right ICA with 1 to 39% stenosis.  The patient has a patent left ICA with no evidence of significant stenosis.    Review of Systems  Neurological:  Positive for seizures.  All other systems reviewed and are negative.      Objective:   Physical Exam Vitals reviewed.  HENT:     Head: Normocephalic.  Cardiovascular:     Rate and Rhythm: Normal rate.  Pulmonary:     Effort: Pulmonary effort is normal.  Skin:    General: Skin is warm and dry.  Neurological:     Mental Status: She is alert and oriented to person, place, and time.  Psychiatric:        Mood and Affect: Mood normal.        Behavior: Behavior normal.        Thought Content: Thought content normal.        Judgment: Judgment normal.     BP 114/73 (BP Location: Right Arm)   Pulse 76   Resp 16   Wt 160 lb 9.6 oz (72.8 kg)   BMI 29.37 kg/m   Past Medical History:  Diagnosis Date   Basal cell  carcinoma    Hyperlipidemia    Hypertension    Obesity     Social History   Socioeconomic History   Marital status: Divorced    Spouse name: Not on file   Number of children: 0   Years of education: Not on file   Highest education level: Not on file  Occupational History   Not on file  Tobacco Use   Smoking status: Never   Smokeless tobacco: Never  Vaping Use   Vaping Use: Never used  Substance and Sexual Activity   Alcohol use: Not Currently   Drug use: No   Sexual activity: Not Currently    Birth control/protection: None  Other Topics Concern   Not on file  Social History Narrative   Pt lives alone with her pets: 4 cats, 2 dogs, and 1 parrot.   Social Determinants of Health   Financial Resource Strain: Low Risk  (01/05/2022)   Overall Financial Resource Strain (CARDIA)    Difficulty of Paying Living Expenses: Not hard at all  Food Insecurity: No Food Insecurity (01/05/2022)   Hunger Vital Sign    Worried About Running Out of Food in the Last Year: Never true    Ran Out of Food in the Last Year: Never  true  Transportation Needs: No Transportation Needs (01/05/2022)   PRAPARE - Hydrologist (Medical): No    Lack of Transportation (Non-Medical): No  Physical Activity: Inactive (01/05/2022)   Exercise Vital Sign    Days of Exercise per Week: 0 days    Minutes of Exercise per Session: 0 min  Stress: No Stress Concern Present (01/05/2022)   [place]    Feeling of Stress : Only a little  Social Connections: Moderately Isolated (01/05/2022)   Social Connection and Isolation Panel [NHANES]    Frequency of Communication with Friends and Family: More than three times a week    Frequency of Social Gatherings with Friends and Family: More than three times a week    Attends Religious Services: More than 4 times per year    Active Member of [employer] or Organizations: No    Attends English as a second language teacher Meetings: Never    Marital Status: Divorced  Human resources officer Violence: Not At Risk (01/05/2022)   Humiliation, Afraid, Rape, and Kick questionnaire    Fear of Current or Ex-Partner: No    Emotionally Abused: No    Physically Abused: No    Sexually Abused: No    Past Surgical History:  Procedure Laterality Date   BREAST CYST ASPIRATION Left    BREAST EXCISIONAL BIOPSY Right    CAROTID PTA/STENT INTERVENTION Left 05/18/2022   Procedure: CAROTID PTA/STENT INTERVENTION;  Surgeon: Algernon Huxley, MD;  Location: [place] [facility];  Service: Cardiovascular;  Laterality: Left;   COLONOSCOPY     COLONOSCOPY WITH PROPOFOL N/A 04/01/2016   Procedure: COLONOSCOPY WITH PROPOFOL;  Surgeon: Manya Silvas, MD;  Location: [facility] ENDOSCOPY;  Service: Endoscopy;  Laterality: N/A;   TONSILLECTOMY     TUBAL LIGATION      Family History  Problem Relation Age of Onset   Hypertension Mother    Heart disease Father    Melanoma Maternal Aunt    Breast cancer Paternal Aunt    Hypertension Maternal Grandmother    Hypertension Maternal Grandfather    Cancer Maternal Grandfather    Hypertension Brother     No Known Allergies     Latest Ref Rng & Units 05/19/2022    6:13 AM 01/28/2022    9:13 AM  CBC  WBC 4.0 - 10.5 K/uL 6.3  7.0   Hemoglobin 12.0 - 15.0 g/dL 12.2  15.5   Hematocrit 36.0 - 46.0 % 36.2  45.5   Platelets 150 - 400 K/uL 220  331       CMP     Component Value Date/Time   NA 139 05/19/2022 0613   NA 143 05/02/2022 0926   K 3.3 (L) 05/19/2022 0613   CL 108 05/19/2022 0613   CO2 25 05/19/2022 0613   GLUCOSE 97 05/19/2022 0613   BUN 9 05/19/2022 0613   BUN 17 05/02/2022 0926   CREATININE 0.52 05/19/2022 0613   CALCIUM 8.6 (L) 05/19/2022 0613   PROT 6.9 05/02/2022 0926   ALBUMIN 4.7 05/02/2022 0926   AST 20 05/02/2022 0926   ALT 13 05/02/2022 0926   ALKPHOS 67 05/02/2022 0926   BILITOT 0.4 05/02/2022 0926   GFRNONAA >60 05/19/2022 0613     No results  found.     Assessment & Plan:   1. Left carotid stenosis The patient had a left carotid artery stent placed on 05/18/2022.  At discharge patient had no significant issues or  difficulties however she subsequently had a seizure and associated brain bleed.  Today she has no residual deficits.  Patient will continue to follow with neurology at [facility]  She will follow-up in 3 months for noninvasive studies.  Patient advised to continue with aspirin and Plavix. - VAS US CAROTID   Current Outpatient Medications on File Prior to Visit  Medication Sig Dispense Refill   aspirin 81 MG chewable tablet Chew 81 mg by mouth daily.     clopidogrel (PLAVIX) 75 MG tablet Take 1 tablet (75 mg total) by mouth daily. 30 tablet 6   fluticasone (FLONASE) 50 MCG/ACT nasal spray Place 2 sprays into both nostrils daily. 16 g 5   levETIRAcetam (KEPPRA) 1000 MG tablet Take by mouth.     lisinopril (ZESTRIL) 20 MG tablet Take by mouth.     montelukast (SINGULAIR) 10 MG tablet TAKE 1 TABLET BY MOUTH EVERYDAY AT BEDTIME 90 tablet 0   Multiple Vitamin (MULTIVITAMIN ADULT PO) Take by mouth. Centrum Silver     pregabalin (LYRICA) 100 MG capsule Take by mouth.     rosuvastatin (CRESTOR) 10 MG tablet Take 1 tablet (10 mg total) by mouth daily. 90 tablet 1   Cholecalciferol (VITAMIN D3 PO) Take by mouth daily.     oxyCODONE-acetaminophen (PERCOCET/ROXICET) 5-325 MG tablet Take 1 tablet by mouth every 6 (six) hours as needed for moderate pain. 20 tablet 0   No current facility-administered medications on file prior to visit.    There are no Patient Instructions on file for this visit. No follow-ups on file.   Kris Hartmann, NP

## 2022-06-29 ENCOUNTER — Other Ambulatory Visit: Payer: Self-pay | Admitting: Internal Medicine

## 2022-06-29 DIAGNOSIS — R053 Chronic cough: Secondary | ICD-10-CM

## 2022-06-29 DIAGNOSIS — E782 Mixed hyperlipidemia: Secondary | ICD-10-CM

## 2022-06-29 NOTE — Telephone Encounter (Signed)
Unable to refill per protocol, requests are too soon. Will refuse request.  Requested Prescriptions  Pending Prescriptions Disp Refills  . rosuvastatin (CRESTOR) 5 MG tablet [Pharmacy Med Name: ROSUVASTATIN CALCIUM 5 MG TAB] 90 tablet 1    Sig: TAKE 1 TABLET (5 MG TOTAL) BY MOUTH DAILY.     Cardiovascular:  Antilipid - Statins 2 Failed - 06/29/2022  9:25 AM      Failed - Lipid Panel in normal range within the last 12 months    Cholesterol, Total  Date Value Ref Range Status  05/02/2022 181 100 - 199 mg/dL Final   LDL Chol Calc (NIH)  Date Value Ref Range Status  05/02/2022 115 (H) 0 - 99 mg/dL Final   HDL  Date Value Ref Range Status  05/02/2022 42 >39 mg/dL Final   Triglycerides  Date Value Ref Range Status  05/02/2022 135 0 - 149 mg/dL Final         Passed - Cr in normal range and within 360 days    Creatinine, Ser  Date Value Ref Range Status  05/19/2022 0.52 0.44 - 1.00 mg/dL Final         Passed - Patient is not pregnant      Passed - Valid encounter within last 12 months    Recent Outpatient Visits          4 weeks ago Essential hypertension   [place] [facility] at [facility], Jesse Sans, MD   1 month ago Essential hypertension   [place] [facility] at [facility], Jesse Sans, MD   5 months ago Annual physical exam   [facility] at [facility], Jesse Sans, MD   10 months ago Allergic cough   [place] [facility] at [facility], Jesse Sans, MD      Future Appointments            In 1 week Army Melia Jesse Sans, MD [facility] at [place], [place]           . montelukast ([place]) 10 MG tablet [Pharmacy Med Name: MONTELUKAST SOD 10 MG TABLET] 90 tablet 0    Sig: TAKE 1 TABLET BY MOUTH EVERYDAY AT BEDTIME     Pulmonology:  Leukotriene Inhibitors Passed - 06/29/2022  9:25  AM      Passed - Valid encounter within last 12 months    Recent Outpatient Visits          4 weeks ago Essential hypertension   [place] [facility] at [facility], Jesse Sans, MD   1 month ago Essential hypertension   [place] at [facility], Jesse Sans, MD   5 months ago Annual physical exam   [facility] at [facility], Jesse Sans, MD   10 months ago Allergic cough   [place] [facility] at [facility], Jesse Sans, MD      Future Appointments            In 1 week Army Melia, Jesse Sans, MD [place] and Sports Medicine at [facility]

## 2022-07-07 ENCOUNTER — Encounter: Payer: Self-pay | Admitting: Internal Medicine

## 2022-07-07 ENCOUNTER — Ambulatory Visit (INDEPENDENT_AMBULATORY_CARE_PROVIDER_SITE_OTHER): Payer: Medicare HMO | Admitting: Internal Medicine

## 2022-07-07 VITALS — BP 128/78 | HR 72 | Ht 62.0 in | Wt 160.0 lb

## 2022-07-07 DIAGNOSIS — R569 Unspecified convulsions: Secondary | ICD-10-CM

## 2022-07-07 DIAGNOSIS — Z23 Encounter for immunization: Secondary | ICD-10-CM | POA: Diagnosis not present

## 2022-07-07 DIAGNOSIS — E782 Mixed hyperlipidemia: Secondary | ICD-10-CM | POA: Diagnosis not present

## 2022-07-07 DIAGNOSIS — I10 Essential (primary) hypertension: Secondary | ICD-10-CM

## 2022-07-07 NOTE — Progress Notes (Signed)
Date:  07/07/2022   Name:  Heather Dean   DOB:  02/20/1956   MRN:  [id number]   Chief Complaint: No chief complaint on file.  Hypertension This is a chronic problem. The problem is controlled. Pertinent negatives include no chest pain, headaches, palpitations or shortness of breath. Past treatments include ACE inhibitors. Hypertensive end-organ damage includes CVA.  Hyperlipidemia This is a chronic problem. Pertinent negatives include no chest pain or shortness of breath. Current antihyperlipidemic treatment includes statins (recently started).   Seizure - she has had no further seizures since discharge.  On Keppra and Lyrica.  [place] neurology is seeing her again in 6 mo to follow up the aneurysm.  They are not addressing the seizure medications or return to driving. Recent CT: IMPRESSION:  1. The 1 mm opacity along the posterior aspect of the right clinoid ICA is  less well seen but otherwise without significant interval change, which may  represent an infundibulum or small saccular aneurysm. Otherwise without  evidence of intracranial vessel flow limiting stenosis or aneurysm.  2. No hemodynamically significant stenosis of the right carotid system by  NASCET criteria.  3. No hemodynamically significant stenosis of the left carotid system by  NASCET criteria. The left common/ICA stent with mild narrowing in the  distal aspect of the intracranial ICA. No significant change.  4. Vertebral arteries are patent.  5. Previously noted subarachnoid and intracranial hemorrhage has resolved.  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 No acute intracranial abnormality seen.     Lab Results  Component Value Date   NA 139 05/19/2022   K 3.3 (L) 05/19/2022   CO2 25 05/19/2022   GLUCOSE 97 05/19/2022   BUN 9 05/19/2022   CREATININE 0.52 05/19/2022   CALCIUM 8.6 (L) 05/19/2022   EGFR 92 05/02/2022   GFRNONAA >60 05/19/2022   Lab Results  Component  Value Date   CHOL 181 05/02/2022   HDL 42 05/02/2022   LDLCALC 115 (H) 05/02/2022   TRIG 135 05/02/2022   CHOLHDL 4.3 05/02/2022   Lab Results  Component Value Date   TSH 1.700 01/28/2022   Lab Results  Component Value Date   HGBA1C 5.5 01/28/2022   Lab Results  Component Value Date   WBC 6.3 05/19/2022   HGB 12.2 05/19/2022   HCT 36.2 05/19/2022   MCV 83.0 05/19/2022   PLT 220 05/19/2022   Lab Results  Component Value Date   ALT 13 05/02/2022   AST 20 05/02/2022   ALKPHOS 67 05/02/2022   BILITOT 0.4 05/02/2022   No results found for: "25OHVITD2", "25OHVITD3", "VD25OH"   Review of Systems  Constitutional:  Positive for fatigue. Negative for chills and unexpected weight change.  HENT:  Negative for nosebleeds. Congestion: she thinks it is due to all the new medication.  Eyes:  Negative for visual disturbance.  Respiratory:  Negative for cough, chest tightness, shortness of breath and wheezing.   Cardiovascular:  Negative for chest pain, palpitations and leg swelling.  Gastrointestinal:  Negative for abdominal pain, constipation and diarrhea.  Neurological:  Negative for dizziness, tremors, seizures, weakness, light-headedness and headaches.  Psychiatric/Behavioral:  Negative for confusion, decreased concentration and sleep disturbance. The patient is not nervous/anxious.     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[place]) 05/21/2022   Seizure ([place]) 05/21/2022   Carotid stenosis, left 05/18/2022   Mixed hyperlipidemia 01/30/2022  Bruit of right carotid artery 01/28/2022   Abnormal mammogram 01/28/2022   Allergic cough 08/18/2021   Essential hypertension 08/18/2021    No Known Allergies  Past Surgical History:  Procedure Laterality Date   BREAST CYST ASPIRATION Left    BREAST EXCISIONAL BIOPSY Right    CAROTID PTA/STENT INTERVENTION Left 05/18/2022   Procedure: CAROTID PTA/STENT  INTERVENTION;  Surgeon: Algernon Huxley, MD;  Location: [place];  Service: Cardiovascular;  Laterality: Left;   COLONOSCOPY     COLONOSCOPY WITH PROPOFOL N/A 04/01/2016   Procedure: COLONOSCOPY WITH PROPOFOL;  Surgeon: Manya Silvas, MD;  [facility]: [facility] END[facility];  Service: Endoscopy;  Laterality: N/A;   TONSILLECTOMY     TUBAL LIGATION      Social History   Tobacco Use   Smoking status: Never   Smokeless tobacco: Never  Vaping Use   Vaping Use: Never used  Substance Use Topics   Alcohol use: Not Currently   Drug use: No     Medication list has been reviewed and updated.  Current Meds  Medication Sig   aspirin 81 MG chewable tablet Chew 81 mg by mouth daily.   clopidogrel (PLAVIX) 75 MG tablet Take 1 tablet (75 mg total) by mouth daily.   fluticasone (FLONASE) 50 MCG/ACT nasal spray Place 2 sprays into both nostrils daily.   levETIRAcetam (KEPPRA) 1000 MG tablet Take by mouth.   lisinopril (ZESTRIL) 20 MG tablet Take by mouth.   montelukast (SINGULAIR) 10 MG tablet TAKE 1 TABLET BY MOUTH EVERYDAY AT BEDTIME   Multiple Vitamin (MULTIVITAMIN ADULT PO) Take by mouth. Centrum Silver   pregabalin (LYRICA) 100 MG capsule Take by mouth.   rosuvastatin (CRESTOR) 10 MG tablet Take 1 tablet (10 mg total) by mouth daily.       07/07/2022   11:03 AM 06/01/2022    1:57 PM 05/02/2022    8:30 AM 01/28/2022    8:33 AM  GAD 7 : Generalized Anxiety Score  Nervous, Anxious, on Edge 0 0 0 0  Control/stop worrying 0 0 0 0  Worry too much - different things 0 0 0 0  Trouble relaxing 0 0 0 1  Restless 0 1 0 0  Easily annoyed or irritable 0 0 0 0  Afraid - awful might happen 0 0 0 0  Total GAD 7 Score 0 1 0 1  Anxiety Difficulty Not difficult at all Not difficult at all Not difficult at all        07/07/2022   11:03 AM 06/01/2022    1:55 PM 05/02/2022    8:30 AM  Depression screen PHQ 2/9  Decreased Interest 1 1 0  Down, Depressed, Hopeless 0 0 0  PHQ - 2 Score 1 1 0   Altered sleeping 0 0 2  Tired, decreased energy '3 2 1  ' Change in appetite 0 0 0  Feeling bad or failure about yourself  0 0 0  Trouble concentrating 1 1 0  Moving slowly or fidgety/restless 0 1 0  Suicidal thoughts 0 0 0  PHQ-9 Score '5 5 3  ' Difficult doing work/chores Not difficult at all Not difficult at all Not difficult at all    BP Readings from Last 3 Encounters:  07/07/22 128/78  06/27/22 114/73  06/01/22 122/80    Physical Exam Vitals and nursing note reviewed.  Constitutional:      General: She is not in acute distress.    Appearance: Normal appearance. She is well-developed.  HENT:  Head: Normocephalic and atraumatic.  Neck:     Vascular: No carotid bruit.  Cardiovascular:     Rate and Rhythm: Normal rate and regular rhythm.     Heart sounds: No murmur heard. Pulmonary:     Effort: Pulmonary effort is normal. No respiratory distress.     Breath sounds: No wheezing or rhonchi.  Musculoskeletal:     Cervical back: Normal range of motion and neck supple.     Right lower leg: No edema.     Left lower leg: No edema.  Lymphadenopathy:     Cervical: No cervical adenopathy.  Skin:    General: Skin is warm and dry.     Capillary Refill: Capillary refill takes less than 2 seconds.     Findings: No rash.  Neurological:     General: No focal deficit present.     Mental Status: She is alert and oriented to person, place, and time.  Psychiatric:        Mood and Affect: Mood normal.        Behavior: Behavior normal.     Wt Readings from Last 3 Encounters:  07/07/22 160 lb (72.6 kg)  06/27/22 160 lb 9.6 oz (72.8 kg)  06/01/22 157 lb (71.2 kg)    BP 128/78 (BP Location: Right Arm, Cuff Size: Large)   Pulse 72   Ht '5\' 2"'  (1.575 m)   Wt 160 lb (72.6 kg)   BMI 29.26 kg/m   Assessment and Plan: 1. Essential hypertension Clinically stable exam with well controlled BP. Tolerating medications without side effects at this time. Pt to continue current regimen  and low sodium diet; benefits of regular exercise as able discussed.  2. Mixed hyperlipidemia On statin and due for follow up labs - Comprehensive metabolic panel - Lipid panel  3. Seizure ([place]) None since discharge - she is anxious to cut back on medications and wants advise about resuming driving. [facility] to [facility] since [facility] is only following up the small aneurysm. - Levetiracetam level - Ambulatory referral to Neurology  4. Need for immunization against influenza - Flu Vaccine QUAD High Dose(Fluad)   Partially dictated using Editor, commissioning. Any errors are unintentional.  Halina Maidens, MD [facility]  07/07/2022

## 2022-07-09 LAB — COMPREHENSIVE METABOLIC PANEL
ALT: 14 IU/L (ref 0–32)
AST: 29 IU/L (ref 0–40)
Albumin/Globulin Ratio: 2.2 (ref 1.2–2.2)
Albumin: 4.8 g/dL (ref 3.9–4.9)
Alkaline Phosphatase: 76 IU/L (ref 44–121)
BUN/Creatinine Ratio: 19 (ref 12–28)
BUN: 12 mg/dL (ref 8–27)
Bilirubin Total: 0.4 mg/dL (ref 0.0–1.2)
CO2: 21 mmol/L (ref 20–29)
Calcium: 10 mg/dL (ref 8.7–10.3)
Chloride: 100 mmol/L (ref 96–106)
Creatinine, Ser: 0.62 mg/dL (ref 0.57–1.00)
Globulin, Total: 2.2 g/dL (ref 1.5–4.5)
Glucose: 85 mg/dL (ref 70–99)
Potassium: 4.4 mmol/L (ref 3.5–5.2)
Sodium: 139 mmol/L (ref 134–144)
Total Protein: 7 g/dL (ref 6.0–8.5)
eGFR: 97 mL/min/{1.73_m2} (ref >59)

## 2022-07-09 LAB — LIPID PANEL
Chol/HDL Ratio: 3.4 ratio (ref 0.0–4.4)
Cholesterol, Total: 162 mg/dL (ref 100–199)
HDL: 48 mg/dL (ref >39)
LDL Chol Calc (NIH): 97 mg/dL (ref 0–99)
Triglycerides: 94 mg/dL (ref 0–149)
VLDL Cholesterol Cal: 17 mg/dL (ref 5–40)

## 2022-07-09 LAB — LEVETIRACETAM LEVEL: Levetiracetam Lvl: 40.3 ug/mL — ABNORMAL HIGH (ref 10.0–40.0)

## 2022-07-11 ENCOUNTER — Other Ambulatory Visit: Payer: Self-pay

## 2022-07-11 DIAGNOSIS — R569 Unspecified convulsions: Secondary | ICD-10-CM

## 2022-07-11 MED ORDER — LEVETIRACETAM 750 MG PO TABS: 750.0000 mg | ORAL_TABLET | Freq: Two times a day (BID) | ORAL | 0 refills | Status: DC

## 2022-07-24 ENCOUNTER — Other Ambulatory Visit: Payer: Self-pay | Admitting: Internal Medicine

## 2022-07-24 DIAGNOSIS — R569 Unspecified convulsions: Secondary | ICD-10-CM

## 2022-07-24 DIAGNOSIS — R053 Chronic cough: Secondary | ICD-10-CM

## 2022-07-25 NOTE — Telephone Encounter (Signed)
Rx 07/11/22 #90- too soon- 45 day supply Requested Prescriptions  Pending Prescriptions Disp Refills  . levETIRAcetam (KEPPRA) 750 MG tablet [Pharmacy Med Name: LEVETIRACETAM 750 MG TABLET] 90 tablet 0    Sig: TAKE 1 TABLET BY MOUTH 2 TIMES DAILY.     Neurology:  Anticonvulsants - levetiracetam Passed - 07/24/2022  2:22 PM      Passed - Cr in normal range and within 360 days    Creatinine, Ser  Date Value Ref Range Status  07/07/2022 0.62 0.57 - 1.00 mg/dL Final         Passed - Completed PHQ-2 or PHQ-9 in the last 360 days      Passed - Valid encounter within last 12 months    Recent Outpatient Visits          2 weeks ago Essential hypertension   [place] [facility] at [facility], Jesse Sans, MD   1 month ago Essential hypertension   [place] [facility] at [facility], Jesse Sans, MD   2 months ago Essential hypertension   [place] [facility] at [facility], Jesse Sans, MD   5 months ago Annual physical exam   [facility] at [facility], Jesse Sans, MD   11 months ago Allergic cough   [place] [facility] at [facility], Jesse Sans, MD      Future Appointments            In 6 months Army Melia, Jesse Sans, MD [facility] Primary [facility] and Sports Medicine at [place], [place]           . montelukast ([place]) 10 MG tablet [Pharmacy Med Name: MONTELUKAST SOD 10 MG TABLET] 90 tablet 0    Sig: TAKE 1 TABLET BY MOUTH EVERYDAY AT BEDTIME     Pulmonology:  Leukotriene Inhibitors Passed - 07/24/2022  2:22 PM      Passed - Valid encounter within last 12 months    Recent Outpatient Visits          2 weeks ago Essential hypertension   [place] [facility] at [facility], Jesse Sans, MD   1 month ago Essential hypertension   [place] at [facility], Jesse Sans, MD   2 months ago Essential hypertension   [place] at [facility], Jesse Sans, MD   5 months ago Annual physical exam   [place] at [facility], Jesse Sans, MD   11 months ago Allergic cough   [place] at [facility], Jesse Sans, MD      Future Appointments            In 6 months Army Melia, Jesse Sans, MD [place] at [facility], [facility]

## 2022-08-08 ENCOUNTER — Encounter (INDEPENDENT_AMBULATORY_CARE_PROVIDER_SITE_OTHER): Payer: Self-pay

## 2022-08-22 ENCOUNTER — Other Ambulatory Visit: Payer: Self-pay | Admitting: Internal Medicine

## 2022-08-22 DIAGNOSIS — R569 Unspecified convulsions: Secondary | ICD-10-CM

## 2022-08-23 ENCOUNTER — Other Ambulatory Visit: Payer: Self-pay | Admitting: Internal Medicine

## 2022-08-23 ENCOUNTER — Other Ambulatory Visit (INDEPENDENT_AMBULATORY_CARE_PROVIDER_SITE_OTHER): Payer: Self-pay | Admitting: Nurse Practitioner

## 2022-08-23 DIAGNOSIS — I6522 Occlusion and stenosis of left carotid artery: Secondary | ICD-10-CM

## 2022-08-23 DIAGNOSIS — E782 Mixed hyperlipidemia: Secondary | ICD-10-CM

## 2022-08-23 NOTE — Telephone Encounter (Signed)
Dosage changed. Requested Prescriptions  Pending Prescriptions Disp Refills   rosuvastatin (CRESTOR) 5 MG tablet [Pharmacy Med Name: ROSUVASTATIN CALCIUM 5 MG TAB] 90 tablet 1    Sig: TAKE 1 TABLET (5 MG TOTAL) BY MOUTH DAILY.     Cardiovascular:  Antilipid - Statins 2 Failed - 08/23/2022  8:41 AM      Failed - Lipid Panel in normal range within the last 12 months    Cholesterol, Total  Date Value Ref Range Status  07/07/2022 162 100 - 199 mg/dL Final   LDL Chol Calc (NIH)  Date Value Ref Range Status  07/07/2022 97 0 - 99 mg/dL Final   HDL  Date Value Ref Range Status  07/07/2022 48 >39 mg/dL Final   Triglycerides  Date Value Ref Range Status  07/07/2022 94 0 - 149 mg/dL Final         Passed - Cr in normal range and within 360 days    Creatinine, Ser  Date Value Ref Range Status  07/07/2022 0.62 0.57 - 1.00 mg/dL Final         Passed - Patient is not pregnant      Passed - Valid encounter within last 12 months    Recent Outpatient Visits           1 month ago Essential hypertension   [place] [facility] at [facility], Jesse Sans, MD   2 months ago Essential hypertension   [place] [facility] at [facility], Jesse Sans, MD   3 months ago Essential hypertension   [place] [facility] at [facility], Jesse Sans, MD   6 months ago Annual physical exam   [facility] at [facility], Jesse Sans, MD   1 year ago Allergic cough   [place] [facility] at [facility], Jesse Sans, MD       Future Appointments             In 5 months Army Melia, Jesse Sans, MD [place] [facility] and [facility] Medicine at [facility], [facility]

## 2022-08-23 NOTE — Telephone Encounter (Signed)
Requested Prescriptions  Pending Prescriptions Disp Refills   levETIRAcetam (KEPPRA) 750 MG tablet [Pharmacy Med Name: LEVETIRACETAM 750 MG TABLET] 90 tablet 0    Sig: TAKE 1 TABLET BY MOUTH 2 TIMES DAILY.     Neurology:  Anticonvulsants - levetiracetam Passed - 08/22/2022  9:47 AM      Passed - Cr in normal range and within 360 days    Creatinine, Ser  Date Value Ref Range Status  07/07/2022 0.62 0.57 - 1.00 mg/dL Final         Passed - Completed PHQ-2 or PHQ-9 in the last 360 days      Passed - Valid encounter within last 12 months    Recent Outpatient Visits           1 month ago Essential hypertension   [place] [facility] at [facility], Jesse Sans, MD   2 months ago Essential hypertension   [place] [facility] at [facility], Jesse Sans, MD   3 months ago Essential hypertension   [place] [facility] at [facility], Jesse Sans, MD   6 months ago Annual physical exam   [facility] at [facility], Jesse Sans, MD   1 year ago Allergic cough   [place] [facility] and [facility] Medicine at [facility], Jesse Sans, MD       Future Appointments             In 5 months Army Melia, Jesse Sans, MD [place] [facility] and [facility] at [facility], [facility]

## 2022-09-26 ENCOUNTER — Other Ambulatory Visit (INDEPENDENT_AMBULATORY_CARE_PROVIDER_SITE_OTHER): Payer: Self-pay | Admitting: Nurse Practitioner

## 2022-09-26 DIAGNOSIS — I6523 Occlusion and stenosis of bilateral carotid arteries: Secondary | ICD-10-CM

## 2022-09-27 ENCOUNTER — Encounter (INDEPENDENT_AMBULATORY_CARE_PROVIDER_SITE_OTHER): Payer: Self-pay | Admitting: Vascular Surgery

## 2022-09-27 ENCOUNTER — Ambulatory Visit (INDEPENDENT_AMBULATORY_CARE_PROVIDER_SITE_OTHER): Payer: Medicare HMO

## 2022-09-27 ENCOUNTER — Ambulatory Visit (INDEPENDENT_AMBULATORY_CARE_PROVIDER_SITE_OTHER): Payer: Medicare HMO | Admitting: Vascular Surgery

## 2022-09-27 VITALS — BP 123/77 | HR 72 | Resp 16 | Wt 161.4 lb

## 2022-09-27 DIAGNOSIS — E782 Mixed hyperlipidemia: Secondary | ICD-10-CM

## 2022-09-27 DIAGNOSIS — I6523 Occlusion and stenosis of bilateral carotid arteries: Secondary | ICD-10-CM | POA: Diagnosis not present

## 2022-09-27 DIAGNOSIS — I10 Essential (primary) hypertension: Secondary | ICD-10-CM | POA: Diagnosis not present

## 2022-09-27 DIAGNOSIS — I610 Nontraumatic intracerebral hemorrhage in hemisphere, subcortical: Secondary | ICD-10-CM

## 2022-09-27 DIAGNOSIS — I6522 Occlusion and stenosis of left carotid artery: Secondary | ICD-10-CM | POA: Diagnosis not present

## 2022-09-27 NOTE — Assessment & Plan Note (Signed)
Duplex today shows minimal right carotid stenosis with a widely patent left carotid stent.  Doing well.  Continue aspirin, Plavix, and Crestor.  Recheck in 6 months with duplex.

## 2022-09-27 NOTE — Assessment & Plan Note (Signed)
lipid control important in reducing the progression of atherosclerotic disease. Continue statin therapy  

## 2022-09-27 NOTE — Progress Notes (Signed)
MRN : [id number]  Heather Dean is a 66 y.o. (1955-11-26) female who presents with chief complaint of  Chief Complaint  Patient presents with   Follow-up    Ultrasound follow up  .  History of Present Illness: Patient returns today in follow up of her carotid disease.  She is about 4 months status post left carotid stent placement for high-grade stenosis.  She had some reperfusion issues so neurology but is doing well now without complaints.  Continues on aspirin and Plavix daily.  Duplex today shows minimal right carotid stenosis with a widely patent left carotid stent.  Current Outpatient Medications  Medication Sig Dispense Refill   aspirin 81 MG chewable tablet Chew 81 mg by mouth daily.     clopidogrel (PLAVIX) 75 MG tablet TAKE 1 TABLET BY MOUTH EVERY DAY 90 tablet 2   fluticasone (FLONASE) 50 MCG/ACT nasal spray Place 2 sprays into both nostrils daily. 16 g 5   levETIRAcetam (KEPPRA) 750 MG tablet TAKE 1 TABLET BY MOUTH 2 TIMES DAILY. 90 tablet 0   lisinopril (ZESTRIL) 20 MG tablet Take by mouth.     montelukast (SINGULAIR) 10 MG tablet TAKE 1 TABLET BY MOUTH EVERYDAY AT BEDTIME 90 tablet 0   Multiple Vitamin (MULTIVITAMIN ADULT PO) Take by mouth. Centrum Silver     pregabalin (LYRICA) 100 MG capsule Take by mouth.     rosuvastatin (CRESTOR) 10 MG tablet Take 1 tablet (10 mg total) by mouth daily. 90 tablet 1   levETIRAcetam (KEPPRA) 1000 MG tablet Take by mouth.     No current facility-administered medications for this visit.    Past Medical History:  Diagnosis Date   Basal cell carcinoma    Hyperlipidemia    Hypertension    Obesity     Past Surgical History:  Procedure Laterality Date   BREAST CYST ASPIRATION Left    BREAST EXCISIONAL BIOPSY Right    CAROTID PTA/STENT INTERVENTION Left 05/18/2022   Procedure: CAROTID PTA/STENT INTERVENTION;  Surgeon: Algernon Huxley, MD;  Location: [place] [facility];  Service: Cardiovascular;  Laterality: Left;   COLONOSCOPY      COLONOSCOPY WITH PROPOFOL N/A 04/01/2016   Procedure: COLONOSCOPY WITH PROPOFOL;  Surgeon: Manya Silvas, MD;  Location: [facility] ENDOSCOPY;  Service: Endoscopy;  Laterality: N/A;   TONSILLECTOMY     TUBAL LIGATION       Social History   Tobacco Use   Smoking status: Never   Smokeless tobacco: Never  Vaping Use   Vaping Use: Never used  Substance Use Topics   Alcohol use: Not Currently   Drug use: No       Family History  Problem Relation Age of Onset   Hypertension Mother    Heart disease Father    Melanoma Maternal Aunt    Breast cancer Paternal Aunt    Hypertension Maternal Grandmother    Hypertension Maternal Grandfather    Cancer Maternal Grandfather    Hypertension Brother      No Known Allergies   REVIEW OF SYSTEMS (Negative unless checked)  Constitutional: _0 Weight loss  _1 Fever  _2 Chills Cardiac: _3 Chest pain   _4 Chest pressure   _5 Palpitations   _6 Shortness of breath when laying flat   _7 Shortness of breath at rest   _8 Shortness of breath with exertion. Vascular:  _9 Pain in legs with walking   _10 Pain in legs at rest   _11 Pain in legs when laying flat   _12 Claudication   _13 Pain in feet when walking  _14   Pain in feet at rest  _0 Pain in feet when laying flat   _1 History of DVT   _2 Phlebitis   _3 Swelling in legs   _4 Varicose veins   _5 Non-healing ulcers Pulmonary:   _6 Uses home oxygen   _7 Productive cough   _8 Hemoptysis   _9 Wheeze  _10 COPD   _11 Asthma Neurologic:  _12 Dizziness  _13 Blackouts   _14 Seizures   _15 History of stroke   _16 History of TIA  _17 Aphasia   _18 Temporary blindness   _19 Dysphagia   _20 Weakness or numbness in arms   _21 Weakness or numbness in legs Musculoskeletal:  _22 Arthritis   _23 Joint swelling   _24 Joint pain   _25 Low back pain Hematologic:  _26 Easy bruising  _27 Easy bleeding   _28 Hypercoagulable state   _29 Anemic   Gastrointestinal:  _30 Blood in stool   _31 Vomiting blood  _32 Gastroesophageal reflux/heartburn   _33 Abdominal pain Genitourinary:  _34 Chronic kidney disease    _35 Difficult urination  _36 Frequent urination  _37 Burning with urination   _38 Hematuria Skin:  _39 Rashes   _40 Ulcers   _41 Wounds Psychological:  _42 History of anxiety   _43  History of major depression.  Physical Examination  BP 123/77 (BP Location: Left Arm)   Pulse 72   Resp 16   Wt 161 lb 6.4 oz (73.2 kg)   BMI 29.52 kg/m  Gen:  WD/WN, NAD Head: [place]/AT, No temporalis wasting. Ear/Nose/Throat: Hearing grossly intact, nares w/o erythema or drainage Eyes: Conjunctiva clear. Sclera non-icteric Neck: Supple.  Trachea midline Pulmonary:  Good air movement, no use of accessory muscles.  Cardiac: RRR, no JVD Vascular:  Vessel Right Left  Radial Palpable Palpable               Musculoskeletal: M/S 5/5 throughout.  No deformity or atrophy. No edema. Neurologic: Sensation grossly intact in extremities.  Symmetrical.  Speech is fluent.  Psychiatric: Judgment intact, Mood & affect appropriate for pt's clinical situation. Dermatologic: No rashes or ulcers noted.  No cellulitis or open wounds.      Labs Recent Results (from the past 2160 hour(s))  Comprehensive metabolic panel     Status: None   Collection Time: 07/07/22 12:24 PM  Result Value Ref Range   Glucose 85 70 - 99 mg/dL   BUN 12 8 - 27 mg/dL   Creatinine, Ser 0.62 0.57 - 1.00 mg/dL   eGFR 97 >59 mL/min/1.73   BUN/Creatinine Ratio 19 12 - 28   Sodium 139 134 - 144 mmol/L   Potassium 4.4 3.5 - 5.2 mmol/L   Chloride 100 96 - 106 mmol/L   CO2 21 20 - 29 mmol/L   Calcium 10.0 8.7 - 10.3 mg/dL   Total Protein 7.0 6.0 - 8.5 g/dL   Albumin 4.8 3.9 - 4.9 g/dL   Globulin, Total 2.2 1.5 - 4.5 g/dL   Albumin/Globulin Ratio 2.2 1.2 - 2.2   Bilirubin Total 0.4 0.0 - 1.2 mg/dL   Alkaline Phosphatase 76 44 - 121 IU/L   AST 29 0 - 40 IU/L   ALT 14 0 - 32 IU/L  Lipid panel     Status: None   Collection Time: 07/07/22 12:24 PM  Result Value Ref Range   Cholesterol, Total 162 100 - 199 mg/dL   Triglycerides 94 0 - 149 mg/dL   HDL 48 >39  mg/dL   VLDL Cholesterol Cal 17 5 - 40 mg/dL   LDL Chol Calc (NIH) 97 0 - 99 mg/dL   Chol/HDL Ratio 3.4 0.0 - 4.4 ratio    Comment:  T. Chol/HDL Ratio                                             Men  Women                               1/2 Avg.Risk  3.4    3.3                                   Avg.Risk  5.0    4.4                                2X Avg.Risk  9.6    7.1                                3X Avg.Risk 23.4   11.0   Levetiracetam level     Status: Abnormal   Collection Time: 07/07/22 12:24 PM  Result Value Ref Range   Levetiracetam Lvl 40.3 (H) 10.0 - 40.0 ug/mL    Radiology No results found.  Assessment/Plan  Carotid stenosis, left  Duplex today shows minimal right carotid stenosis with a widely patent left carotid stent.  Doing well.  Continue aspirin, Plavix, and Crestor.  Recheck in 6 months with duplex.  Nontraumatic subcortical hemorrhage of left cerebral hemisphere [facility]) Follows with neurology  Essential hypertension blood pressure control important in reducing the progression of atherosclerotic disease. On appropriate oral medications.   Mixed hyperlipidemia lipid control important in reducing the progression of atherosclerotic disease. Continue statin therapy    [name], MD  09/27/2022 10:40 AM    This note was created with Dragon medical transcription system.  Any errors from dictation are purely unintentional

## 2022-09-27 NOTE — Assessment & Plan Note (Signed)
Follows with neurology 

## 2022-09-27 NOTE — Assessment & Plan Note (Signed)
blood pressure control important in reducing the progression of atherosclerotic disease. On appropriate oral medications.  

## 2022-09-30 ENCOUNTER — Other Ambulatory Visit: Payer: Self-pay | Admitting: Internal Medicine

## 2022-09-30 DIAGNOSIS — R569 Unspecified convulsions: Secondary | ICD-10-CM

## 2022-09-30 NOTE — Telephone Encounter (Signed)
Requested Prescriptions  Pending Prescriptions Disp Refills   levETIRAcetam (KEPPRA) 750 MG tablet [Pharmacy Med Name: LEVETIRACETAM 750 MG TABLET] 180 tablet 1    Sig: TAKE 1 TABLET BY MOUTH TWICE A DAY     Neurology:  Anticonvulsants - levetiracetam Passed - 09/30/2022  2:32 PM      Passed - Cr in normal range and within 360 days    Creatinine, Ser  Date Value Ref Range Status  07/07/2022 0.62 0.57 - 1.00 mg/dL Final         Passed - Completed PHQ-2 or PHQ-9 in the last 360 days      Passed - Valid encounter within last 12 months    Recent Outpatient Visits           2 months ago Essential hypertension   [place] [facility] at [facility], Jesse Sans, MD   4 months ago Essential hypertension   [place] [facility] at [facility], Jesse Sans, MD   5 months ago Essential hypertension   [place] [facility] at [facility], Jesse Sans, MD   8 months ago Annual physical exam   [facility] at [facility], Jesse Sans, MD   1 year ago Allergic cough   [place] [facility] at [facility], Jesse Sans, MD       Future Appointments             In 4 months Army Melia, Jesse Sans, MD [place] [facility] Medicine at [facility], [facility]

## 2022-10-20 ENCOUNTER — Other Ambulatory Visit: Payer: Self-pay | Admitting: Internal Medicine

## 2022-10-20 DIAGNOSIS — R053 Chronic cough: Secondary | ICD-10-CM

## 2022-10-20 NOTE — Telephone Encounter (Signed)
Requested Prescriptions  Pending Prescriptions Disp Refills   montelukast (SINGULAIR) 10 MG tablet [Pharmacy Med Name: MONTELUKAST SOD 10 MG TABLET] 90 tablet 0    Sig: TAKE 1 TABLET BY MOUTH EVERYDAY AT BEDTIME     Pulmonology:  Leukotriene Inhibitors Passed - 10/20/2022 11:51 AM      Passed - Valid encounter within last 12 months    Recent Outpatient Visits           3 months ago Essential hypertension   [place] [facility] at [facility], Jesse Sans, MD   4 months ago Essential hypertension   [place] [facility] at [facility], Jesse Sans, MD   5 months ago Essential hypertension   [place] [facility] at [facility], Jesse Sans, MD   8 months ago Annual physical exam   [facility] at [facility], Jesse Sans, MD   1 year ago Allergic cough   [place] [facility] at [facility], Jesse Sans, MD       Future Appointments             In 3 months Army Melia, Jesse Sans, MD [facility] at [facility], [facility]

## 2022-11-17 ENCOUNTER — Other Ambulatory Visit: Payer: Self-pay | Admitting: Internal Medicine

## 2022-11-17 DIAGNOSIS — E782 Mixed hyperlipidemia: Secondary | ICD-10-CM

## 2022-11-18 ENCOUNTER — Other Ambulatory Visit: Payer: Self-pay | Admitting: Internal Medicine

## 2022-11-18 DIAGNOSIS — R058 Other specified cough: Secondary | ICD-10-CM

## 2022-11-18 NOTE — Telephone Encounter (Signed)
Requested Prescriptions  Pending Prescriptions Disp Refills   fluticasone (FLONASE) 50 MCG/ACT nasal spray [Pharmacy Med Name: FLUTICASONE PROP 50 MCG SPRAY] 48 mL 0    Sig: SPRAY 2 SPRAYS INTO EACH NOSTRIL EVERY DAY     Ear, Nose, and Throat: Nasal Preparations - Corticosteroids Passed - 11/18/2022  4:31 AM      Passed - Valid encounter within last 12 months    Recent Outpatient Visits           4 months ago Essential hypertension   [place] [facility] at [facility], Jesse Sans, MD   5 months ago Essential hypertension   [place] [facility] at [facility], Jesse Sans, MD   6 months ago Essential hypertension   [place] at [facility], Jesse Sans, MD   9 months ago Annual physical exam   [facility] at [facility], Jesse Sans, MD   1 year ago Allergic cough   [place] at [facility], Jesse Sans, MD       Future Appointments             In 2 months Army Melia, Jesse Sans, MD [place] at [facility], [facility]

## 2022-11-30 ENCOUNTER — Other Ambulatory Visit: Payer: Self-pay | Admitting: Internal Medicine

## 2022-11-30 NOTE — Telephone Encounter (Signed)
Requested medication (s) are due for refill today: yes  Requested medication (s) are on the active medication list: yes  Last refill:  06/01/22  Future visit scheduled: yes  Notes to clinic:  Unable to refill per protocol, cannot delegate.      Requested Prescriptions  Pending Prescriptions Disp Refills   pregabalin (LYRICA) 100 MG capsule      Sig: Take by mouth.     Not Delegated - Neurology:  Anticonvulsants - Controlled - pregabalin Failed - 11/30/2022  2:29 PM      Failed - This refill cannot be delegated      Passed - Cr in normal range and within 360 days    Creatinine, Ser  Date Value Ref Range Status  07/07/2022 0.62 0.57 - 1.00 mg/dL Final         Passed - Completed PHQ-2 or PHQ-9 in the last 360 days      Passed - Valid encounter within last 12 months    Recent Outpatient Visits           4 months ago Essential hypertension   [place] [facility], Jesse Sans, MD   6 months ago Essential hypertension   [place] at [facility], Jesse Sans, MD   7 months ago Essential hypertension   [place] at [facility], Jesse Sans, MD   10 months ago Annual physical exam   [place] at [facility], Jesse Sans, MD   1 year ago Allergic cough   [place] at [facility], Jesse Sans, MD       Future Appointments             In 2 months Army Melia, Jesse Sans, MD [place] at [facility], [facility]

## 2022-11-30 NOTE — Telephone Encounter (Signed)
Please review.  KP

## 2022-11-30 NOTE — Telephone Encounter (Unsigned)
Copied from [place] [id number]. Topic: General - Other >> Nov 30, 2022 12:42 PM Everette C wrote: Reason for CRM: Medication Refill - Medication: pregabalin (LYRICA) 100 MG capsule [id number]  Has the patient contacted their pharmacy? Yes.   (Agent: If no, request that the patient contact the pharmacy for the refill. If patient does not wish to contact the pharmacy document the reason why and proceed with request.) (Agent: If yes, when and what did the pharmacy advise?)  Preferred Pharmacy (with phone number or street name): [id number] [id number]: [phone]: [phone]: Not open 24 hours   Has the patient been seen for an appointment in the last year OR does the patient have an upcoming appointment? Yes.    Agent: Please be advised that RX refills may take up to 3 business days. We ask that you follow-up with your pharmacy.

## 2022-12-01 NOTE — Telephone Encounter (Signed)
Spoke to pt she will call her neurologist at [place] to see if they will refill the medication for her.  KP

## 2023-01-19 ENCOUNTER — Ambulatory Visit (INDEPENDENT_AMBULATORY_CARE_PROVIDER_SITE_OTHER): Payer: Medicare HMO

## 2023-01-19 ENCOUNTER — Other Ambulatory Visit: Payer: Self-pay | Admitting: Internal Medicine

## 2023-01-19 VITALS — Ht 62.0 in | Wt 161.0 lb

## 2023-01-19 DIAGNOSIS — Z0000 Encounter for general adult medical examination without abnormal findings: Secondary | ICD-10-CM

## 2023-01-19 DIAGNOSIS — R053 Chronic cough: Secondary | ICD-10-CM

## 2023-01-19 NOTE — Telephone Encounter (Signed)
Requested Prescriptions  Pending Prescriptions Disp Refills   montelukast (SINGULAIR) 10 MG tablet [Pharmacy Med Name: MONTELUKAST SOD 10 MG TABLET] 90 tablet 0    Sig: TAKE 1 TABLET BY MOUTH EVERYDAY AT BEDTIME     Pulmonology:  Leukotriene Inhibitors Passed - 01/19/2023  1:41 AM      Passed - Valid encounter within last 12 months    Recent Outpatient Visits           6 months ago Essential hypertension   [place] [facility] at [facility], [name], MD   7 months ago Essential hypertension   [place] [facility] at [facility], [name], MD   8 months ago Essential hypertension   [place] [facility] at [facility], [name], MD   11 months ago Annual physical exam   [facility] at [facility], [name], MD   1 year ago Allergic cough   [place] [facility] at [facility], [name], MD       Future Appointments             In 1 week [name], [name], MD [facility] & [facility] at [facility], [facility]

## 2023-01-19 NOTE — Progress Notes (Signed)
I connected with  Heather SaupeShelia A Canning on 01/19/23 by a audio enabled telemedicine application and verified that I am speaking with the correct person using two identifiers.  Patient Location: Home  Provider Location: Office/Clinic  I discussed the limitations of evaluation and management by telemedicine. The patient expressed understanding and agreed to proceed.  Subjective:   Heather Dean is a 67 y.o. female who presents for Medicare Annual (Subsequent) preventive examination.  Review of Systems     Cardiac Risk Factors include: advanced age (>6055men, 29>65 women);hypertension     Objective:    There were no vitals filed for this visit. There is no height or weight on file to calculate BMI.     01/19/2023   11:30 AM 05/18/2022    7:14 AM 01/05/2022   10:51 AM  Advanced Directives  Does Patient Have a Medical Advance Directive? No No Yes  Type of Best boyAdvance Directive   Healthcare Power of [place];Living will  Copy of Healthcare Power of Attorney in Chart?   No - copy requested  Would patient like information on creating a medical advance directive? No - Patient declined Yes (ED - Information included in AVS)     Current Medications (verified) Outpatient Encounter Medications as of 01/19/2023  Medication Sig   aspirin 81 MG chewable tablet Chew 81 mg by mouth daily.   clopidogrel (PLAVIX) 75 MG tablet TAKE 1 TABLET BY MOUTH EVERY DAY   fluticasone (FLONASE) 50 MCG/ACT nasal spray SPRAY 2 SPRAYS INTO EACH NOSTRIL EVERY DAY   levETIRAcetam (KEPPRA) 750 MG tablet TAKE 1 TABLET BY MOUTH TWICE A DAY   lisinopril (ZESTRIL) 20 MG tablet Take by mouth.   montelukast (SINGULAIR) 10 MG tablet TAKE 1 TABLET BY MOUTH EVERYDAY AT BEDTIME   Multiple Vitamin (MULTIVITAMIN ADULT PO) Take by mouth. Centrum Silver   rosuvastatin (CRESTOR) 10 MG tablet TAKE 1 TABLET BY MOUTH EVERY DAY   [DISCONTINUED] pregabalin (LYRICA) 100 MG capsule Take by mouth.   No facility-administered encounter medications on  file as of 01/19/2023.    Allergies (verified) Patient has no known allergies.   History: Past Medical History:  Diagnosis Date   Basal cell carcinoma    Hyperlipidemia    Hypertension    Obesity    Past Surgical History:  Procedure Laterality Date   BREAST CYST ASPIRATION Left    BREAST EXCISIONAL BIOPSY Right    CAROTID PTA/STENT INTERVENTION Left 05/18/2022   Procedure: CAROTID PTA/STENT INTERVENTION;  Surgeon: Annice Needyew, Jason S, MD;  Location: [facility] INVASIVE CV LAB;  Service: Cardiovascular;  Laterality: Left;   COLONOSCOPY     COLONOSCOPY WITH PROPOFOL N/A 04/01/2016   Procedure: COLONOSCOPY WITH PROPOFOL;  Surgeon: Scot Junobert T Elliott, MD;  [facility];  Service: Endoscopy;  Laterality: N/A;   TONSILLECTOMY     TUBAL LIGATION     Family History  Problem Relation Age of Onset   Hypertension Mother    Heart disease Father    Melanoma Maternal Aunt    Breast cancer Paternal Aunt    Hypertension Maternal Grandmother    Hypertension Maternal Grandfather    Cancer Maternal Grandfather    Hypertension Brother    Social History   Socioeconomic History   Marital status: Divorced    Spouse name: Not on file   Number of children: 0   Years of education: Not on file   Highest education level: Not on file  Occupational History   Not on file  Tobacco Use   Smoking  status: Never   Smokeless tobacco: Never  Vaping Use   Vaping Use: Never used  Substance and Sexual Activity   Alcohol use: Not Currently   Drug use: No   Sexual activity: Not Currently    Birth control/protection: None  Other Topics Concern   Not on file  Social History Narrative   Pt lives alone with her pets: 4 cats, 2 dogs, and 1 parrot.   Social Determinants of Health   Financial Resource Strain: Low Risk  (01/19/2023)   Overall Financial Resource Strain (CARDIA)    Difficulty of Paying Living Expenses: Not hard at all  Food Insecurity: No Food Insecurity (01/19/2023)   Hunger Vital Sign     Worried About Running Out of Food in the Last Year: Never true    Ran Out of Food in the Last Year: Never true  Transportation Needs: No Transportation Needs (01/19/2023)   PRAPARE - Administrator, Civil Service (Medical): No    Lack of Transportation (Non-Medical): No  Physical Activity: Insufficiently Active (01/19/2023)   Exercise Vital Sign    Days of Exercise per Week: 5 days    Minutes of Exercise per Session: 20 min  Stress: No Stress Concern Present (01/19/2023)   [employer] of Occupational Health - Occupational Stress Questionnaire    Feeling of Stress : Not at all  Social Connections: Moderately Isolated (01/19/2023)   Social Connection and Isolation Panel [NHANES]    Frequency of Communication with Friends and Family: More than three times a week    Frequency of Social Gatherings with Friends and Family: More than three times a week    Attends Religious Services: More than 4 times per year    Active Member of [employer] or Organizations: No    Attends Engineer, structural: Never    Marital Status: Divorced    Tobacco Counseling Counseling given: Not Answered   Clinical Intake:  Pre-visit preparation completed: Yes  Pain : No/denies pain     Nutritional Risks: None Diabetes: No  How often do you need to have someone help you when you read instructions, pamphlets, or other written materials from your doctor or pharmacy?: 1 - Never  Diabetic?no  Interpreter Needed?: No  Information entered by :: Kennedy Bucker, LPN   Activities of Daily Living    01/19/2023   11:31 AM 01/17/2023    8:44 AM  In your present state of health, do you have any difficulty performing the following activities:  Hearing? 0 0  Vision? 0 0  Difficulty concentrating or making decisions? 0 0  Walking or climbing stairs? 0 0  Dressing or bathing? 0 0  Doing errands, shopping? 0 0  Preparing Food and eating ? N N  Using the Toilet? N N  In the past six months, have  you accidently leaked urine? Y Y  Do you have problems with loss of bowel control? N N  Managing your Medications? N N  Managing your Finances? N N  Housekeeping or managing your Housekeeping? N N    Patient Care Team: Reubin Milan, MD as PCP - General (Internal Medicine)  Indicate any recent Medical Services you may have received from other than [facility] providers in the past year (date may be approximate).     Assessment:   This is a routine wellness examination for Persais.  Hearing/Vision screen Hearing Screening - Comments:: No aids Vision Screening - Comments:: Wears glasses- Dr.Marshall in [place]  Dietary issues and exercise  activities discussed: Current Exercise Habits: Home exercise routine, Type of exercise: walking, Time (Minutes): 30, Frequency (Times/Week): 5, Weekly Exercise (Minutes/Week): 150, Intensity: Mild   Goals Addressed             This Visit's Progress    DIET - EAT MORE FRUITS AND VEGETABLES         Depression Screen    01/19/2023   11:29 AM 07/07/2022   11:03 AM 06/01/2022    1:55 PM 05/02/2022    8:30 AM 01/28/2022    8:33 AM 01/05/2022   10:50 AM 08/18/2021    2:57 PM  PHQ 2/9 Scores  PHQ - 2 Score 0 1 1 0 0 0 0  PHQ- 9 Score 0 5 5 3 1  1     Fall Risk    01/19/2023   11:31 AM 01/17/2023    8:44 AM 07/07/2022   11:03 AM 06/01/2022    1:57 PM 05/02/2022    8:30 AM  Fall Risk   Falls in the past year? 0 0 0 0 0  Number falls in past yr: 0 0 0 0 0  Injury with Fall? 0 0 0 0 0  Risk for fall due to : No Fall Risks  No Fall Risks No Fall Risks No Fall Risks  Follow up Falls prevention discussed;Falls evaluation completed  Falls evaluation completed Falls evaluation completed Falls evaluation completed    FALL RISK PREVENTION PERTAINING TO THE HOME:  Any stairs in or around the home? Yes  If so, are there any without handrails? No  Home free of loose throw rugs in walkways, pet beds, electrical cords, etc? Yes  Adequate lighting in  your home to reduce risk of falls? Yes   ASSISTIVE DEVICES UTILIZED TO PREVENT FALLS:  Life alert? No  Use of a cane, walker or w/c? No  Grab bars in the bathroom? Yes  Shower chair or bench in shower? No  Elevated toilet seat or a handicapped toilet? Yes   Cognitive Function:        01/19/2023   11:36 AM  6CIT Screen  What Year? 0 points  What month? 0 points  What time? 0 points  Count back from 20 0 points  Months in reverse 0 points  Repeat phrase 0 points  Total Score 0 points    Immunizations Immunization History  Administered Date(s) Administered   Fluad Quad(high Dose 65+) 08/18/2021, 07/07/2022   PFIZER(Purple Top)SARS-COV-2 Vaccination 11/30/2019, 01/02/2020   PNEUMOCOCCAL CONJUGATE-20 01/28/2022   Tdap 08/19/2010   Zoster Recombinat (Shingrix) 01/28/2022, 04/14/2022    TDAP status: Due, Education has been provided regarding the importance of this vaccine. Advised may receive this vaccine at local pharmacy or Health Dept. Aware to provide a copy of the vaccination record if obtained from local pharmacy or Health Dept. Verbalized acceptance and understanding.  Flu Vaccine status: Up to date  Pneumococcal vaccine status: Up to date  Covid-19 vaccine status: Completed vaccines  Qualifies for Shingles Vaccine? Yes   Zostavax completed No   Shingrix Completed?: Yes  Screening Tests Health Maintenance  Topic Date Due   COVID-19 Vaccine (3 - Pfizer risk series) 01/30/2020   DTaP/Tdap/Td (2 - Td or Tdap) 08/19/2020   COLONOSCOPY (Pts 45-14yrs Insurance coverage will need to be confirmed)  04/01/2021   MAMMOGRAM  12/24/2022   INFLUENZA VACCINE  05/11/2023   Medicare Annual Wellness (AWV)  01/19/2024   Pneumonia Vaccine 39+ Years old  Completed   DEXA SCAN  Completed  Hepatitis C Screening  Completed   Zoster Vaccines- Shingrix  Completed   HPV VACCINES  Aged Out    Health Maintenance  Health Maintenance Due  Topic Date Due   COVID-19 Vaccine (3 -  Pfizer risk series) 01/30/2020   DTaP/Tdap/Td (2 - Td or Tdap) 08/19/2020   COLONOSCOPY (Pts 45-37yrs Insurance coverage will need to be confirmed)  04/01/2021   MAMMOGRAM  12/24/2022    Colorectal cancer screening: Referral to GI placed 01/19/23. Pt aware the office will call re: appt.  Mammogram status: Ordered 01/19/23. Pt provided with contact info and advised to call to schedule appt.   Bone Density status: Completed 04/11/22. Results reflect: Bone density results: OSTEOPENIA. Repeat every 5 years.  Lung Cancer Screening: (Low Dose CT Chest recommended if Age 68-80 years, 30 pack-year currently smoking OR have quit w/in 15years.) does not qualify.   Additional Screening:  Hepatitis C Screening: does qualify; Completed 01/28/22  Vision Screening: Recommended annual ophthalmology exams for early detection of glaucoma and other disorders of the eye. Is the patient up to date with their annual eye exam?  Yes  Who is the provider or what is the name of the office in which the patient attends annual eye exams? Dr.Marshall If pt is not established with a provider, would they like to be referred to a provider to establish care? No .   Dental Screening: Recommended annual dental exams for proper oral hygiene  Community Resource Referral / Chronic Care Management: CRR required this visit?  No   CCM required this visit?  No      Plan:     I have personally reviewed and noted the following in the patient's chart:   Medical and social history Use of alcohol, tobacco or illicit drugs  Current medications and supplements including opioid prescriptions. Patient is not currently taking opioid prescriptions. Functional ability and status Nutritional status Physical activity Advanced directives List of other physicians Hospitalizations, surgeries, and ER visits in previous 12 months Vitals Screenings to include cognitive, depression, and falls Referrals and appointments  In addition, I  have reviewed and discussed with patient certain preventive protocols, quality metrics, and best practice recommendations. A written personalized care plan for preventive services as well as general preventive health recommendations were provided to patient.     Hal Hope, LPN   11/09/4386   Nurse Notes: none

## 2023-01-19 NOTE — Patient Instructions (Signed)
[name] , Thank you for taking time to come for your Medicare Wellness Visit. I appreciate your ongoing commitment to your health goals. Please review the following plan we discussed and let me know if I can assist you in the future.   These are the goals we discussed:  Goals      DIET - EAT MORE FRUITS AND VEGETABLES     Increase physical activity     Recommend increasing physical activity to at least 3 days per week         This is a list of the screening recommended for you and due dates:  Health Maintenance  Topic Date Due   COVID-19 Vaccine (3 - Pfizer risk series) 01/30/2020   DTaP/Tdap/Td vaccine (2 - Td or Tdap) 08/19/2020   Colon Cancer Screening  04/01/2021   Mammogram  12/24/2022   Flu Shot  05/11/2023   Medicare Annual Wellness Visit  01/19/2024   Pneumonia Vaccine  Completed   DEXA scan (bone density measurement)  Completed   Hepatitis C Screening: USPSTF Recommendation to screen - Ages 110-79 yo.  Completed   Zoster (Shingles) Vaccine  Completed   HPV Vaccine  Aged Out    Advanced directives: no  Conditions/risks identified: none  Next appointment: Follow up in one year for your annual wellness visit 01/25/24 @ 11:00 am by phone   Preventive Care 65 Years and Older, Female Preventive care refers to lifestyle choices and visits with your health care provider that can promote health and wellness. What does preventive care include? A yearly physical exam. This is also called an annual well check. Dental exams once or twice a year. Routine eye exams. Ask your health care provider how often you should have your eyes checked. Personal lifestyle choices, including: Daily care of your teeth and gums. Regular physical activity. Eating a healthy diet. Avoiding tobacco and drug use. Limiting alcohol use. Practicing safe sex. Taking low-dose aspirin every day. Taking vitamin and mineral supplements as recommended by your health care provider. What happens during  an annual well check? The services and screenings done by your health care provider during your annual well check will depend on your age, overall health, lifestyle risk factors, and family history of disease. Counseling  Your health care provider may ask you questions about your: Alcohol use. Tobacco use. Drug use. Emotional well-being. Home and relationship well-being. Sexual activity. Eating habits. History of falls. Memory and ability to understand (cognition). Work and work Astronomer. Reproductive health. Screening  You may have the following tests or measurements: Height, weight, and BMI. Blood pressure. Lipid and cholesterol levels. These may be checked every 5 years, or more frequently if you are over 34 years old. Skin check. Lung cancer screening. You may have this screening every year starting at age 24 if you have a 30-pack-year history of smoking and currently smoke or have quit within the past 15 years. Fecal occult blood test (FOBT) of the stool. You may have this test every year starting at age 27. Flexible sigmoidoscopy or colonoscopy. You may have a sigmoidoscopy every 5 years or a colonoscopy every 10 years starting at age 62. Hepatitis C blood test. Hepatitis B blood test. Sexually transmitted disease (STD) testing. Diabetes screening. This is done by checking your blood sugar (glucose) after you have not eaten for a while (fasting). You may have this done every 1-3 years. Bone density scan. This is done to screen for osteoporosis. You may have this done starting at  age 45. Mammogram. This may be done every 1-2 years. Talk to your health care provider about how often you should have regular mammograms. Talk with your health care provider about your test results, treatment options, and if necessary, the need for more tests. Vaccines  Your health care provider may recommend certain vaccines, such as: Influenza vaccine. This is recommended every year. Tetanus,  diphtheria, and acellular pertussis (Tdap, Td) vaccine. You may need a Td booster every 10 years. Zoster vaccine. You may need this after age 25. Pneumococcal 13-valent conjugate (PCV13) vaccine. One dose is recommended after age 8. Pneumococcal polysaccharide (PPSV23) vaccine. One dose is recommended after age 47. Talk to your health care provider about which screenings and vaccines you need and how often you need them. This information is not intended to replace advice given to you by your health care provider. Make sure you discuss any questions you have with your health care provider. Document Released: 10/23/2015 Document Revised: 06/15/2016 Document Reviewed: 07/28/2015 Elsevier Interactive Patient Education  2017 [employer] Prevention in the Home Falls can cause injuries. They can happen to people of all ages. There are many things you can do to make your home safe and to help prevent falls. What can I do on the outside of my home? Regularly fix the edges of walkways and driveways and fix any cracks. Remove anything that might make you trip as you walk through a door, such as a raised step or threshold. Trim any bushes or trees on the path to your home. Use bright outdoor lighting. Clear any walking paths of anything that might make someone trip, such as rocks or tools. Regularly check to see if handrails are loose or broken. Make sure that both sides of any steps have handrails. Any raised decks and porches should have guardrails on the edges. Have any leaves, snow, or ice cleared regularly. Use sand or salt on walking paths during winter. Clean up any spills in your garage right away. This includes oil or grease spills. What can I do in the bathroom? Use night lights. Install grab bars by the toilet and in the tub and shower. Do not use towel bars as grab bars. Use non-skid mats or decals in the tub or shower. If you need to sit down in the shower, use a plastic,  non-slip stool. Keep the floor dry. Clean up any water that spills on the floor as soon as it happens. Remove soap buildup in the tub or shower regularly. Attach bath mats securely with double-sided non-slip rug tape. Do not have throw rugs and other things on the floor that can make you trip. What can I do in the bedroom? Use night lights. Make sure that you have a light by your bed that is easy to reach. Do not use any sheets or blankets that are too big for your bed. They should not hang down onto the floor. Have a firm chair that has side arms. You can use this for support while you get dressed. Do not have throw rugs and other things on the floor that can make you trip. What can I do in the kitchen? Clean up any spills right away. Avoid walking on wet floors. Keep items that you use a lot in easy-to-reach places. If you need to reach something above you, use a strong step stool that has a grab bar. Keep electrical cords out of the way. Do not use floor polish or wax that makes floors  slippery. If you must use wax, use non-skid floor wax. Do not have throw rugs and other things on the floor that can make you trip. What can I do with my stairs? Do not leave any items on the stairs. Make sure that there are handrails on both sides of the stairs and use them. Fix handrails that are broken or loose. Make sure that handrails are as long as the stairways. Check any carpeting to make sure that it is firmly attached to the stairs. Fix any carpet that is loose or worn. Avoid having throw rugs at the top or bottom of the stairs. If you do have throw rugs, attach them to the floor with carpet tape. Make sure that you have a light switch at the top of the stairs and the bottom of the stairs. If you do not have them, ask someone to add them for you. What else can I do to help prevent falls? Wear shoes that: Do not have high heels. Have rubber bottoms. Are comfortable and fit you well. Are closed  at the toe. Do not wear sandals. If you use a stepladder: Make sure that it is fully opened. Do not climb a closed stepladder. Make sure that both sides of the stepladder are locked into place. Ask someone to hold it for you, if possible. Clearly mark and make sure that you can see: Any grab bars or handrails. First and last steps. Where the edge of each step is. Use tools that help you move around (mobility aids) if they are needed. These include: Canes. Walkers. Scooters. Crutches. Turn on the lights when you go into a dark area. Replace any light bulbs as soon as they burn out. Set up your furniture so you have a clear path. Avoid moving your furniture around. If any of your floors are uneven, fix them. If there are any pets around you, be aware of where they are. Review your medicines with your doctor. Some medicines can make you feel dizzy. This can increase your chance of falling. Ask your doctor what other things that you can do to help prevent falls. This information is not intended to replace advice given to you by your health care provider. Make sure you discuss any questions you have with your health care provider. Document Released: 07/23/2009 Document Revised: 03/03/2016 Document Reviewed: 10/31/2014 Elsevier Interactive Patient Education  2017 [employer].

## 2023-02-01 ENCOUNTER — Ambulatory Visit (INDEPENDENT_AMBULATORY_CARE_PROVIDER_SITE_OTHER): Payer: Medicare HMO | Admitting: Internal Medicine

## 2023-02-01 ENCOUNTER — Encounter: Payer: Self-pay | Admitting: Internal Medicine

## 2023-02-01 VITALS — BP 120/60 | HR 79 | Ht 62.0 in | Wt 162.6 lb

## 2023-02-01 DIAGNOSIS — I610 Nontraumatic intracerebral hemorrhage in hemisphere, subcortical: Secondary | ICD-10-CM

## 2023-02-01 DIAGNOSIS — R569 Unspecified convulsions: Secondary | ICD-10-CM

## 2023-02-01 DIAGNOSIS — E782 Mixed hyperlipidemia: Secondary | ICD-10-CM | POA: Diagnosis not present

## 2023-02-01 DIAGNOSIS — G4733 Obstructive sleep apnea (adult) (pediatric): Secondary | ICD-10-CM | POA: Diagnosis not present

## 2023-02-01 DIAGNOSIS — S60512A Abrasion of left hand, initial encounter: Secondary | ICD-10-CM

## 2023-02-01 DIAGNOSIS — Z1231 Encounter for screening mammogram for malignant neoplasm of breast: Secondary | ICD-10-CM | POA: Diagnosis not present

## 2023-02-01 DIAGNOSIS — I10 Essential (primary) hypertension: Secondary | ICD-10-CM

## 2023-02-01 DIAGNOSIS — Z23 Encounter for immunization: Secondary | ICD-10-CM

## 2023-02-01 DIAGNOSIS — D6869 Other thrombophilia: Secondary | ICD-10-CM

## 2023-02-01 DIAGNOSIS — Z0000 Encounter for general adult medical examination without abnormal findings: Secondary | ICD-10-CM

## 2023-02-01 DIAGNOSIS — Z1211 Encounter for screening for malignant neoplasm of colon: Secondary | ICD-10-CM

## 2023-02-01 DIAGNOSIS — R058 Other specified cough: Secondary | ICD-10-CM

## 2023-02-01 MED ORDER — LISINOPRIL 20 MG PO TABS
20.0000 mg | ORAL_TABLET | Freq: Every day | ORAL | 3 refills | Status: DC
Start: 2023-02-01 — End: 2024-02-16

## 2023-02-01 MED ORDER — FEXOFENADINE HCL 180 MG PO TABS
180.0000 mg | ORAL_TABLET | Freq: Every day | ORAL | 1 refills | Status: DC
Start: 2023-02-01 — End: 2023-07-18

## 2023-02-01 NOTE — Assessment & Plan Note (Signed)
Tolerating statin medications without concerns LDL is  Lab Results  Component Value Date   LDLCALC 97 07/07/2022  On Crestor with a goal of < 70. Current dose will be adjusted if needed.

## 2023-02-01 NOTE — Patient Instructions (Signed)
Call [facility] to schedule your mammogram at [phone].  

## 2023-02-01 NOTE — Progress Notes (Signed)
Date:  02/01/2023   Name:  Heather Dean   DOB:  [birth year]   MRN:  [id number]   Chief Complaint: Annual Exam Heather Dean is a 67 y.o. female who presents today for her Complete Annual Exam. She feels well. She reports exercising- none. She reports she is sleeping fairly well. Breast complaints - none.  Mammogram: 12/2021 DEXA: none Pap smear: discontinued Colonoscopy: 2017 repeat 5 yrs  Health Maintenance Due  Topic Date Due   COVID-19 Vaccine (3 - Pfizer risk series) 01/30/2020   COLONOSCOPY (Pts 45-61yrs Insurance coverage will need to be confirmed)  04/01/2021   MAMMOGRAM  12/24/2022    Immunization History  Administered Date(s) Administered   Fluad Quad(high Dose 65+) 08/18/2021, 07/07/2022   PFIZER(Purple Top)SARS-COV-2 Vaccination 11/30/2019, 01/02/2020   PNEUMOCOCCAL CONJUGATE-20 01/28/2022   Tdap 08/19/2010, 02/01/2023   Zoster Recombinat (Shingrix) 01/28/2022, 04/14/2022   CT Angiogram 12/2022: IMPRESSION: 1. Previously suspected aneurysm is not seen on current scan. No evidence of intracranial vessel flow limiting stenosis or aneurysm. 2. 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   Hypertension This is a chronic problem. The problem is controlled. Pertinent negatives include no chest pain, headaches, palpitations or shortness of breath. Past treatments include ACE inhibitors. The current treatment provides significant improvement. Hypertensive end-organ damage includes CVA. There is no history of kidney disease or CAD/MI.  Hyperlipidemia This is a chronic problem. The problem is controlled. Pertinent negatives include no chest pain or shortness of breath. Current antihyperlipidemic treatment includes statins.  CVA - continues on Keppra for seizure prevention for a total of three years.  No residual deficit at this time.  She sometimes gets lightheaded when leaning over. Abrasion - left hand from a cat scratch 2 days ago by her cat.  Healing but  tetanus is out of date.  Lab Results  Component Value Date   NA 139 07/07/2022   K 4.4 07/07/2022   CO2 21 07/07/2022   GLUCOSE 85 07/07/2022   BUN 12 07/07/2022   CREATININE 0.62 07/07/2022   CALCIUM 10.0 07/07/2022   EGFR 97 07/07/2022   GFRNONAA >60 05/19/2022   Lab Results  Component Value Date   CHOL 162 07/07/2022   HDL 48 07/07/2022   LDLCALC 97 07/07/2022   TRIG 94 07/07/2022   CHOLHDL 3.4 07/07/2022   Lab Results  Component Value Date   TSH 1.700 01/28/2022   Lab Results  Component Value Date   HGBA1C 5.5 01/28/2022   Lab Results  Component Value Date   WBC 6.3 05/19/2022   HGB 12.2 05/19/2022   HCT 36.2 05/19/2022   MCV 83.0 05/19/2022   PLT 220 05/19/2022   Lab Results  Component Value Date   ALT 14 07/07/2022   AST 29 07/07/2022   ALKPHOS 76 07/07/2022   BILITOT 0.4 07/07/2022   No results found for: "25OHVITD2", "25OHVITD3", "VD25OH"   Review of Systems  Constitutional:  Negative for chills, fatigue and fever.  HENT:  Negative for congestion, hearing loss, tinnitus, trouble swallowing and voice change.   Eyes:  Negative for visual disturbance.  Respiratory:  Negative for cough, chest tightness, shortness of breath and wheezing.   Cardiovascular:  Negative for chest pain, palpitations and leg swelling.  Gastrointestinal:  Negative for abdominal pain, constipation, diarrhea and vomiting.  Endocrine: Negative for polydipsia and polyuria.  Genitourinary:  Negative for dysuria, frequency, genital sores, vaginal bleeding and vaginal discharge.  Musculoskeletal:  Negative for arthralgias, gait problem and joint  swelling.  Skin:  Positive for wound (abrasion from cat scratch on left hand). Negative for color change and rash.  Neurological:  Negative for dizziness, tremors, light-headedness and headaches.  Hematological:  Negative for adenopathy. Does not bruise/bleed easily.  Psychiatric/Behavioral:  Negative for dysphoric mood and sleep disturbance.  The patient is not nervous/anxious.     Patient Active Problem List   Diagnosis Date Noted   OSA (obstructive sleep apnea) 02/01/2023   Acquired thrombophilia 02/01/2023   Nontraumatic subcortical hemorrhage of left cerebral hemisphere 05/21/2022   Seizure 05/21/2022   Carotid stenosis, left 05/18/2022   Mixed hyperlipidemia 01/30/2022   Bruit of right carotid artery 01/28/2022   Abnormal mammogram 01/28/2022   Allergic cough 08/18/2021   Essential hypertension 08/18/2021    No Known Allergies  Past Surgical History:  Procedure Laterality Date   BREAST CYST ASPIRATION Left    BREAST EXCISIONAL BIOPSY Right    CAROTID PTA/STENT INTERVENTION Left 05/18/2022   Procedure: CAROTID PTA/STENT INTERVENTION;  Surgeon: Annice Needy, MD;  Location: [facility]ASIVE [facility];  Service: Cardiovascular;  Laterality: Left;   COLONOSCOPY     COLONOSCOPY WITH PROPOFOL N/A 04/01/2016   Procedure: COLONOSCOPY WITH PROPOFOL;  Surgeon: Scot Jun, MD;  Location: [facility] ENDOSCOPY;  Service: Endoscopy;  Laterality: N/A;   TONSILLECTOMY     TUBAL LIGATION      Social History   Tobacco Use   Smoking status: Never   Smokeless tobacco: Never  Vaping Use   Vaping Use: Never used  Substance Use Topics   Alcohol use: Not Currently   Drug use: No     Medication list has been reviewed and updated.  Current Meds  Medication Sig   aspirin 81 MG chewable tablet Chew 81 mg by mouth daily.   clopidogrel (PLAVIX) 75 MG tablet TAKE 1 TABLET BY MOUTH EVERY DAY   fexofenadine (ALLEGRA ALLERGY) 180 MG tablet Take 1 tablet (180 mg total) by mouth daily.   fluticasone (FLONASE) 50 MCG/ACT nasal spray SPRAY 2 SPRAYS INTO EACH NOSTRIL EVERY DAY   levETIRAcetam (KEPPRA) 750 MG tablet TAKE 1 TABLET BY MOUTH TWICE A DAY   montelukast (SINGULAIR) 10 MG tablet TAKE 1 TABLET BY MOUTH EVERYDAY AT BEDTIME   Multiple Vitamin (MULTIVITAMIN ADULT PO) Take by mouth. Centrum Silver   rosuvastatin (CRESTOR) 10 MG tablet  TAKE 1 TABLET BY MOUTH EVERY DAY   [DISCONTINUED] lisinopril (ZESTRIL) 20 MG tablet Take by mouth.       02/01/2023    8:26 AM 07/07/2022   11:03 AM 06/01/2022    1:57 PM 05/02/2022    8:30 AM  GAD 7 : Generalized Anxiety Score  Nervous, Anxious, on Edge 0 0 0 0  Control/stop worrying 0 0 0 0  Worry too much - different things 0 0 0 0  Trouble relaxing 1 0 0 0  Restless 0 0 1 0  Easily annoyed or irritable 0 0 0 0  Afraid - awful might happen 0 0 0 0  Total GAD 7 Score 1 0 1 0  Anxiety Difficulty Not difficult at all Not difficult at all Not difficult at all Not difficult at all       02/01/2023    8:26 AM 01/19/2023   11:29 AM 07/07/2022   11:03 AM  Depression screen PHQ 2/9  Decreased Interest 0 0 1  Down, Depressed, Hopeless 0 0 0  PHQ - 2 Score 0 0 1  Altered sleeping 3 0 0  Tired,  decreased energy 3 0 3  Change in appetite 1 0 0  Feeling bad or failure about yourself  0 0 0  Trouble concentrating 0 0 1  Moving slowly or fidgety/restless 0 0 0  Suicidal thoughts 0 0 0  PHQ-9 Score 7 0 5  Difficult doing work/chores Not difficult at all Not difficult at all Not difficult at all    BP Readings from Last 3 Encounters:  02/01/23 120/60  09/27/22 123/77  07/07/22 128/78    Physical Exam Vitals and nursing note reviewed.  Constitutional:      General: She is not in acute distress.    Appearance: She is well-developed.  HENT:     Head: Normocephalic and atraumatic.     Right Ear: Tympanic membrane and ear canal normal.     Left Ear: Tympanic membrane and ear canal normal.     Nose:     Right Sinus: No maxillary sinus tenderness.     Left Sinus: No maxillary sinus tenderness.  Eyes:     General: No scleral icterus.       Right eye: No discharge.        Left eye: No discharge.     Conjunctiva/sclera: Conjunctivae normal.  Neck:     Thyroid: No thyromegaly.     Vascular: No carotid bruit.  Cardiovascular:     Rate and Rhythm: Normal rate and regular rhythm.      Pulses: Normal pulses.     Heart sounds: Normal heart sounds.  Pulmonary:     Effort: Pulmonary effort is normal. No respiratory distress.     Breath sounds: No wheezing.  Chest:  Breasts:    Right: No mass, nipple discharge, skin change or tenderness.     Left: No mass, nipple discharge, skin change or tenderness.  Abdominal:     General: Bowel sounds are normal.     Palpations: Abdomen is soft.     Tenderness: There is no abdominal tenderness.  Musculoskeletal:     Cervical back: Normal range of motion. No erythema.     Right lower leg: No edema.     Left lower leg: No edema.  Lymphadenopathy:     Cervical: No cervical adenopathy.  Skin:    General: Skin is warm and dry.     Findings: Wound present. No rash.     Comments: One inch abrasion dorsum of left hand from cat scratch 2 days ago at home.  Neurological:     Mental Status: She is alert and oriented to person, place, and time.     Cranial Nerves: No cranial nerve deficit.     Sensory: No sensory deficit.     Deep Tendon Reflexes: Reflexes are normal and symmetric.  Psychiatric:        Attention and Perception: Attention normal.        Mood and Affect: Mood normal.     Wt Readings from Last 3 Encounters:  02/01/23 162 lb 9.6 oz (73.8 kg)  01/19/23 161 lb (73 kg)  09/27/22 161 lb 6.4 oz (73.2 kg)    BP 120/60 (BP Location: Right Arm, Cuff Size: Large)   Pulse 79   Ht  (1.575 m)   Wt 162 lb 9.6 oz (73.8 kg)   SpO2 98%   BMI 29.74 kg/m   Assessment and Plan:  Problem List Items Addressed This Visit       Cardiovascular and Mediastinum   Essential hypertension (Chronic)    Clinically stable exam  with well controlled BP on lisinopril and hctz. Tolerating medications without side effects. Pt to continue current regimen and low sodium diet.       Relevant Medications   lisinopril (ZESTRIL) 20 MG tablet   Other Relevant Orders   CBC with Differential/Platelet   Comprehensive metabolic panel   TSH    Urinalysis, Routine w reflex microscopic     Respiratory   OSA (obstructive sleep apnea)     Nervous and Auditory   Nontraumatic subcortical hemorrhage of left cerebral hemisphere    Doing well with no deficit Plan for keppra for a total of three years Continue Plavix and Crestor        Hematopoietic and Hemostatic   Acquired thrombophilia    On Plavix s/p CVA reperfusion injury        Other   Allergic cough   Relevant Medications   fexofenadine (ALLEGRA ALLERGY) 180 MG tablet   Mixed hyperlipidemia (Chronic)    Tolerating statin medications without concerns LDL is  Lab Results  Component Value Date   LDLCALC 97 07/07/2022  On Crestor with a goal of < 70. Current dose will be adjusted if needed.       Relevant Medications   lisinopril (ZESTRIL) 20 MG tablet   Other Relevant Orders   Comprehensive metabolic panel   Lipid panel   Seizure (Chronic)    No seizure since onset Continue Keppra for a total of three years Followed by Neurology      Other Visit Diagnoses     Annual physical exam    -  Primary   Encounter for screening mammogram for breast cancer       Relevant Orders   MM 3D SCREENING MAMMOGRAM BILATERAL BREAST   Colon cancer screening       Relevant Orders   Ambulatory referral to Gastroenterology   Abrasion of left hand, initial encounter       Relevant Orders   Tdap vaccine greater than or equal to 7yo IM (Completed)       Return in about 6 months (around 08/03/2023) for HTN.   Partially dictated using Dragon software, any errors are not intentional.  Reubin Milan, MD [facility] [place], [place]

## 2023-02-01 NOTE — Assessment & Plan Note (Signed)
Clinically stable exam with well controlled BP on lisinopril and hctz. Tolerating medications without side effects. Pt to continue current regimen and low sodium diet.  

## 2023-02-01 NOTE — Assessment & Plan Note (Signed)
No seizure since onset Continue Keppra for a total of three years Followed by Neurology

## 2023-02-01 NOTE — Assessment & Plan Note (Signed)
On Plavix s/p CVA reperfusion injury

## 2023-02-01 NOTE — Assessment & Plan Note (Signed)
Doing well with no deficit Plan for keppra for a total of three years Continue Plavix and Crestor

## 2023-02-02 LAB — CBC WITH DIFFERENTIAL/PLATELET
Basophils Absolute: 0 10*3/uL (ref 0.0–0.2)
Basos: 0 %
EOS (ABSOLUTE): 0.2 10*3/uL (ref 0.0–0.4)
Eos: 5 %
Hematocrit: 41.6 % (ref 34.0–46.6)
Hemoglobin: 13.9 g/dL (ref 11.1–15.9)
Immature Grans (Abs): 0 10*3/uL (ref 0.0–0.1)
Immature Granulocytes: 0 %
Lymphocytes Absolute: 1.4 10*3/uL (ref 0.7–3.1)
Lymphs: 30 %
MCH: 28 pg (ref 26.6–33.0)
MCHC: 33.4 g/dL (ref 31.5–35.7)
MCV: 84 fL (ref 79–97)
Monocytes Absolute: 0.5 10*3/uL (ref 0.1–0.9)
Monocytes: 10 %
Neutrophils Absolute: 2.4 10*3/uL (ref 1.4–7.0)
Neutrophils: 55 %
Platelets: 254 10*3/uL (ref 150–450)
RBC: 4.97 x10E6/uL (ref 3.77–5.28)
RDW: 14.1 % (ref 11.7–15.4)
WBC: 4.5 10*3/uL (ref 3.4–10.8)

## 2023-02-02 LAB — COMPREHENSIVE METABOLIC PANEL
ALT: 13 IU/L (ref 0–32)
AST: 17 IU/L (ref 0–40)
Albumin/Globulin Ratio: 2.1 (ref 1.2–2.2)
Albumin: 4.6 g/dL (ref 3.9–4.9)
Alkaline Phosphatase: 62 IU/L (ref 44–121)
BUN/Creatinine Ratio: 27 (ref 12–28)
BUN: 17 mg/dL (ref 8–27)
Bilirubin Total: 0.3 mg/dL (ref 0.0–1.2)
CO2: 24 mmol/L (ref 20–29)
Calcium: 10.1 mg/dL (ref 8.7–10.3)
Chloride: 104 mmol/L (ref 96–106)
Creatinine, Ser: 0.62 mg/dL (ref 0.57–1.00)
Globulin, Total: 2.2 g/dL (ref 1.5–4.5)
Glucose: 103 mg/dL — ABNORMAL HIGH (ref 70–99)
Potassium: 4.4 mmol/L (ref 3.5–5.2)
Sodium: 142 mmol/L (ref 134–144)
Total Protein: 6.8 g/dL (ref 6.0–8.5)
eGFR: 97 mL/min/{1.73_m2} (ref >59)

## 2023-02-02 LAB — LIPID PANEL
Chol/HDL Ratio: 3.7 ratio (ref 0.0–4.4)
Cholesterol, Total: 179 mg/dL (ref 100–199)
HDL: 49 mg/dL (ref >39)
LDL Chol Calc (NIH): 113 mg/dL — ABNORMAL HIGH (ref 0–99)
Triglycerides: 93 mg/dL (ref 0–149)
VLDL Cholesterol Cal: 17 mg/dL (ref 5–40)

## 2023-02-02 LAB — URINALYSIS, ROUTINE W REFLEX MICROSCOPIC
Bilirubin, UA: NEGATIVE
Glucose, UA: NEGATIVE
Ketones, UA: NEGATIVE
Nitrite, UA: NEGATIVE
Protein,UA: NEGATIVE
Specific Gravity, UA: 1.025 (ref 1.005–1.030)
Urobilinogen, Ur: 0.2 mg/dL (ref 0.2–1.0)
pH, UA: 5 (ref 5.0–7.5)

## 2023-02-02 LAB — MICROSCOPIC EXAMINATION
Bacteria, UA: NONE SEEN
Casts: NONE SEEN /lpf
WBC, UA: >30 /hpf — AB (ref 0–5)

## 2023-02-02 LAB — TSH: TSH: 1.36 u[IU]/mL (ref 0.450–4.500)

## 2023-02-06 ENCOUNTER — Other Ambulatory Visit: Payer: Self-pay

## 2023-02-06 ENCOUNTER — Telehealth: Payer: Self-pay

## 2023-02-06 DIAGNOSIS — Z86010 Personal history of colonic polyps: Secondary | ICD-10-CM

## 2023-02-06 MED ORDER — GOLYTELY 236 G PO SOLR: 4000.0000 mL | Freq: Once | ORAL | 0 refills | Status: AC

## 2023-02-06 NOTE — Telephone Encounter (Signed)
Gastroenterology Pre-Procedure Review  Request Date: 03/27/23 Requesting Physician: Dr. Servando Snare  PATIENT REVIEW QUESTIONS: The patient responded to the following health history questions as indicated:    1. Are you having any GI issues? no 2. Do you have a personal history of Polyps? yes (last colonoscopy performed by Dr. Mechele Collin 04/01/2016 polyp was  noted) 3. Do you have a family history of Colon Cancer or Polyps? yes (brother had multiple polyps, father had polyps) 4. Diabetes Mellitus? no 5. Joint replacements in the past 12 months?no 6. Major health problems in the past 3 months?no 7. Any artificial heart valves, MVP, or defibrillator?no    MEDICATIONS & ALLERGIES:    Patient reports the following regarding taking any anticoagulation/antiplatelet therapy:   Plavix, Coumadin, Eliquis, Xarelto, Lovenox, Pradaxa, Brilinta, or Effient? yes (Plavix prescribed by Dr. Wyn Quaker) Aspirin? yes (81 mg)  Patient confirms/reports the following medications:  Current Outpatient Medications  Medication Sig Dispense Refill   aspirin 81 MG chewable tablet Chew 81 mg by mouth daily.     clopidogrel (PLAVIX) 75 MG tablet TAKE 1 TABLET BY MOUTH EVERY DAY 90 tablet 2   fexofenadine (ALLEGRA ALLERGY) 180 MG tablet Take 1 tablet (180 mg total) by mouth daily. 90 tablet 1   fluticasone (FLONASE) 50 MCG/ACT nasal spray SPRAY 2 SPRAYS INTO EACH NOSTRIL EVERY DAY 48 mL 0   levETIRAcetam (KEPPRA) 750 MG tablet TAKE 1 TABLET BY MOUTH TWICE A DAY 180 tablet 1   lisinopril (ZESTRIL) 20 MG tablet Take 1 tablet (20 mg total) by mouth daily. 90 tablet 3   montelukast (SINGULAIR) 10 MG tablet TAKE 1 TABLET BY MOUTH EVERYDAY AT BEDTIME 90 tablet 0   Multiple Vitamin (MULTIVITAMIN ADULT PO) Take by mouth. Centrum Silver     rosuvastatin (CRESTOR) 10 MG tablet TAKE 1 TABLET BY MOUTH EVERY DAY 90 tablet 1   No current facility-administered medications for this visit.    Patient confirms/reports the following allergies:   No Known Allergies  No orders of the defined types were placed in this encounter.   AUTHORIZATION INFORMATION Primary Insurance: 1D#: Group #:  Secondary Insurance: 1D#: Group #:  SCHEDULE INFORMATION: Date: 03/27/23 Time: Location: [facility]

## 2023-02-12 ENCOUNTER — Encounter: Payer: Self-pay | Admitting: Internal Medicine

## 2023-02-13 ENCOUNTER — Ambulatory Visit (INDEPENDENT_AMBULATORY_CARE_PROVIDER_SITE_OTHER): Payer: Medicare HMO | Admitting: Internal Medicine

## 2023-02-13 ENCOUNTER — Encounter: Payer: Self-pay | Admitting: Internal Medicine

## 2023-02-13 VITALS — BP 122/70 | HR 70 | Ht 62.0 in | Wt 162.6 lb

## 2023-02-13 DIAGNOSIS — L237 Allergic contact dermatitis due to plants, except food: Secondary | ICD-10-CM

## 2023-02-13 MED ORDER — PREDNISONE 10 MG PO TABS
ORAL_TABLET | ORAL | 0 refills | Status: AC
Start: 2023-02-13 — End: 2023-02-18

## 2023-02-13 NOTE — Progress Notes (Signed)
Date:  02/13/2023   Name:  Heather Dean   DOB:  01/15/1956   MRN:  [id number]   Chief Complaint: Poison Ivy (X 1 week. Worked in the yard and pulled her gloves off to pull weeds. Getting worse. Rash on fingers, face, and ears. Itching/ throbbing.)  Poison [place] This is a new problem. The current episode started in the past 7 days. The problem is unchanged. The affected locations include the face and right fingers. The rash is characterized by blistering, itchiness, redness and swelling. She was exposed to plant contact. Pertinent negatives include no fatigue, fever, joint pain, shortness of breath or vomiting. Past treatments include anti-itch cream. The treatment provided no relief.    Lab Results  Component Value Date   NA 142 02/01/2023   K 4.4 02/01/2023   CO2 24 02/01/2023   GLUCOSE 103 (H) 02/01/2023   BUN 17 02/01/2023   CREATININE 0.62 02/01/2023   CALCIUM 10.1 02/01/2023   EGFR 97 02/01/2023   GFRNONAA >60 05/19/2022   Lab Results  Component Value Date   CHOL 179 02/01/2023   HDL 49 02/01/2023   LDLCALC 113 (H) 02/01/2023   TRIG 93 02/01/2023   CHOLHDL 3.7 02/01/2023   Lab Results  Component Value Date   TSH 1.360 02/01/2023   Lab Results  Component Value Date   HGBA1C 5.5 01/28/2022   Lab Results  Component Value Date   WBC 4.5 02/01/2023   HGB 13.9 02/01/2023   HCT 41.6 02/01/2023   MCV 84 02/01/2023   PLT 254 02/01/2023   Lab Results  Component Value Date   ALT 13 02/01/2023   AST 17 02/01/2023   ALKPHOS 62 02/01/2023   BILITOT 0.3 02/01/2023   No results found for: "25OHVITD2", "25OHVITD3", "VD25OH"   Review of Systems  Constitutional:  Negative for chills, fatigue and fever.  Respiratory:  Negative for chest tightness and shortness of breath.   Cardiovascular:  Negative for chest pain.  Gastrointestinal:  Negative for vomiting.  Musculoskeletal:  Negative for joint pain.  Skin:  Positive for rash.  Psychiatric/Behavioral:  Negative for  dysphoric mood and sleep disturbance. The patient is not nervous/anxious.     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) 05/21/2022   Seizure (HCC) 05/21/2022   Carotid stenosis, left 05/18/2022   Mixed hyperlipidemia 01/30/2022   Bruit of right carotid artery 01/28/2022   Abnormal mammogram 01/28/2022   Allergic cough 08/18/2021   Essential hypertension 08/18/2021    No Known Allergies  Past Surgical History:  Procedure Laterality Date   BREAST CYST ASPIRATION Left    BREAST EXCISIONAL BIOPSY Right    CAROTID PTA/STENT INTERVENTION Left 05/18/2022   Procedure: CAROTID PTA/STENT INTERVENTION;  Surgeon: Annice Needy, MD;  Location: [facility] INVASIVE CV LAB;  Service: Cardiovascular;  Laterality: Left;   COLONOSCOPY     COLONOSCOPY WITH PROPOFOL N/A 04/01/2016   Procedure: COLONOSCOPY WITH PROPOFOL;  Surgeon: Scot Jun, MD;  Location: [facility] ENDOSCOPY;  Service: Endoscopy;  Laterality: N/A;   TONSILLECTOMY     TUBAL LIGATION      Social History   Tobacco Use   Smoking status: Never   Smokeless tobacco: Never  Vaping Use   Vaping Use: Never used  Substance Use Topics   Alcohol use: Not Currently   Drug use: No  Medication list has been reviewed and updated.  Current Meds  Medication Sig   aspirin 81 MG chewable tablet Chew 81 mg by mouth daily.   clopidogrel (PLAVIX) 75 MG tablet TAKE 1 TABLET BY MOUTH EVERY DAY   fexofenadine (ALLEGRA ALLERGY) 180 MG tablet Take 1 tablet (180 mg total) by mouth daily.   fluticasone (FLONASE) 50 MCG/ACT nasal spray SPRAY 2 SPRAYS INTO EACH NOSTRIL EVERY DAY   levETIRAcetam (KEPPRA) 750 MG tablet TAKE 1 TABLET BY MOUTH TWICE A DAY   lisinopril (ZESTRIL) 20 MG tablet Take 1 tablet (20 mg total) by mouth daily.   montelukast (SINGULAIR) 10 MG tablet TAKE 1 TABLET BY  MOUTH EVERYDAY AT BEDTIME   Multiple Vitamin (MULTIVITAMIN ADULT PO) Take by mouth. Centrum Silver   predniSONE (DELTASONE) 10 MG tablet Take 6 tablets (60 mg total) by mouth daily with breakfast for 1 day, THEN 5 tablets (50 mg total) daily with breakfast for 1 day, THEN 4 tablets (40 mg total) daily with breakfast for 1 day, THEN 3 tablets (30 mg total) daily with breakfast for 1 day, THEN 2 tablets (20 mg total) daily with breakfast for 1 day, THEN 1 tablet (10 mg total) daily with breakfast for 1 day.   rosuvastatin (CRESTOR) 10 MG tablet TAKE 1 TABLET BY MOUTH EVERY DAY       02/13/2023    8:56 AM 02/01/2023    8:26 AM 07/07/2022   11:03 AM 06/01/2022    1:57 PM  GAD 7 : Generalized Anxiety Score  Nervous, Anxious, on Edge 0 0 0 0  Control/stop worrying 0 0 0 0  Worry too much - different things 0 0 0 0  Trouble relaxing 0 1 0 0  Restless 0 0 0 1  Easily annoyed or irritable 0 0 0 0  Afraid - awful might happen 0 0 0 0  Total GAD 7 Score 0 1 0 1  Anxiety Difficulty Not difficult at all Not difficult at all Not difficult at all Not difficult at all       02/13/2023    8:56 AM 02/01/2023    8:26 AM 01/19/2023   11:29 AM  Depression screen PHQ 2/9  Decreased Interest 0 0 0  Down, Depressed, Hopeless 0 0 0  PHQ - 2 Score 0 0 0  Altered sleeping 2 3 0  Tired, decreased energy 0 3 0  Change in appetite 0 1 0  Feeling bad or failure about yourself  0 0 0  Trouble concentrating 0 0 0  Moving slowly or fidgety/restless 0 0 0  Suicidal thoughts 0 0 0  PHQ-9 Score 2 7 0  Difficult doing work/chores Not difficult at all Not difficult at all Not difficult at all    BP Readings from Last 3 Encounters:  02/13/23 122/70  02/01/23 120/60  09/27/22 123/77    Physical Exam Vitals and nursing note reviewed.  Constitutional:      General: She is not in acute distress.    Appearance: Normal appearance. She is well-developed.  HENT:     Head: Normocephalic and atraumatic.   Cardiovascular:     Rate and Rhythm: Normal rate and regular rhythm.  Pulmonary:     Effort: Pulmonary effort is normal. No respiratory distress.     Breath sounds: No wheezing or rhonchi.  Skin:    General: Skin is warm and dry.     Findings: Erythema and rash present. Rash is papular and vesicular.  Comments: On right hand and right side of face  Neurological:     Mental Status: She is alert and oriented to person, place, and time.  Psychiatric:        Mood and Affect: Mood normal.        Behavior: Behavior normal.     Wt Readings from Last 3 Encounters:  02/13/23 162 lb 9.6 oz (73.8 kg)  02/01/23 162 lb 9.6 oz (73.8 kg)  01/19/23 161 lb (73 kg)    BP 122/70   Pulse 70   Ht 5\' 2"  (1.575 m)   Wt 162 lb 9.6 oz (73.8 kg)   SpO2 98%   BMI 29.74 kg/m   Assessment and Plan:  Problem List Items Addressed This Visit   None Visit Diagnoses     Allergic contact dermatitis due to plants, except food    -  Primary   Continue topical cortisone/calamine use Benadryl at bedtime   Relevant Medications   predniSONE (DELTASONE) 10 MG tablet       No follow-ups on file.   Partially dictated using Dragon software, any errors are not intentional.  Reubin Milan, MD [facility] [place], [place]

## 2023-02-16 ENCOUNTER — Telehealth: Payer: Self-pay

## 2023-02-16 ENCOUNTER — Other Ambulatory Visit: Payer: Self-pay | Admitting: Internal Medicine

## 2023-02-16 DIAGNOSIS — R058 Other specified cough: Secondary | ICD-10-CM

## 2023-02-16 DIAGNOSIS — R569 Unspecified convulsions: Secondary | ICD-10-CM

## 2023-02-16 NOTE — Telephone Encounter (Signed)
Patient has been advised to stop Plavix 5 days prior to colonoscopy.  Stop date 06/12.  Restart on 06/18.  Patient verbalized understanding.  Thanks,  [place], [place]

## 2023-02-16 NOTE — Telephone Encounter (Signed)
Requested Prescriptions  Pending Prescriptions Disp Refills   levETIRAcetam (KEPPRA) 750 MG tablet [Pharmacy Med Name: LEVETIRACETAM 750 MG TABLET] 180 tablet 1    Sig: TAKE 1 TABLET BY MOUTH TWICE A DAY     Neurology:  Anticonvulsants - levetiracetam Passed - 02/16/2023  2:36 AM      Passed - Cr in normal range and within 360 days    Creatinine, Ser  Date Value Ref Range Status  02/01/2023 0.62 0.57 - 1.00 mg/dL Final         Passed - Completed PHQ-2 or PHQ-9 in the last 360 days      Passed - Valid encounter within last 12 months    Recent Outpatient Visits           3 days ago Allergic contact dermatitis due to plants, except food   [place] [facility] at [facility], [name], MD   2 weeks ago Annual physical exam   [facility] at [facility], [name], MD   7 months ago Essential hypertension   [place] [facility] at [facility], [name], MD   8 months ago Essential hypertension   [place] [facility] at [facility], [name], MD   9 months ago Essential hypertension   [place] [facility] at [facility], [name], MD       Future Appointments             In 5 months [name] [name], MD [facility] at [facility], [facility]   In 11 months [name] [name], MD [facility] at [facility], [facility]             fluticasone [facility] 50 MCG/ACT nasal spray [Pharmacy Med Name: FLUTICASONE PROP 50 MCG SPRAY] 48 mL 1    Sig: SPRAY 2 SPRAYS INTO EACH NOSTRIL EVERY DAY     Ear, Nose, and Throat: Nasal Preparations - Corticosteroids Passed - 02/16/2023  2:36 AM      Passed - Valid encounter within last 12 months    Recent Outpatient Visits           3 days ago Allergic contact dermatitis due to plants, except food   [place]  [facility] at [facility], [name], MD   2 weeks ago Annual physical exam   [facility] at [facility], [name], MD   7 months ago Essential hypertension   [place] [facility] at [facility], [name], MD   8 months ago Essential hypertension   [place] [facility] at [facility], [name], MD   9 months ago Essential hypertension   [facility] at [facility], [name], MD       Future Appointments             In 5 months [name], [name], MD [facility] at [facility], [facility]   In 11 months [name], [name], MD [facility] Medicine at [facility], [facility]

## 2023-03-15 ENCOUNTER — Other Ambulatory Visit: Payer: Self-pay | Admitting: Internal Medicine

## 2023-03-15 DIAGNOSIS — R921 Mammographic calcification found on diagnostic imaging of breast: Secondary | ICD-10-CM

## 2023-03-20 ENCOUNTER — Encounter: Payer: Self-pay | Admitting: Gastroenterology

## 2023-03-21 ENCOUNTER — Encounter: Payer: Self-pay | Admitting: Gastroenterology

## 2023-03-21 NOTE — Anesthesia Preprocedure Evaluation (Addendum)
Anesthesia Evaluation  Patient identified by MRN, date of birth, ID band Patient awake    Reviewed: Allergy & Precautions, H&P , NPO status , Patient's Chart, lab work & pertinent test results  Airway Mallampati: III  TM Distance: <3 FB Neck ROM: Full    Dental no notable dental hx.    Pulmonary neg pulmonary ROS, sleep apnea  12-06-22 sleep study: Mild, non-positional Obstructive Sleep Apnea. Consider treatment with CPAP.  Patient did not wish to utilize CPAP    Pulmonary exam normal breath sounds clear to auscultation       Cardiovascular hypertension, + CAD  negative cardio ROS Normal cardiovascular exam Rhythm:Regular Rate:Normal     Neuro/Psych Seizures -,  06-23-22 IMPRESSION: 1. Previously suspected aneurysm is not seen on current scan. No evidence of intracranial vessel flow limiting stenosis or aneurysm. 2. 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    negative neurological ROS  negative psych ROS   GI/Hepatic negative GI ROS, Neg liver ROS,,,  Endo/Other  negative endocrine ROS    Renal/GU negative Renal ROS  negative genitourinary   Musculoskeletal negative musculoskeletal ROS (+)    Abdominal   Peds negative pediatric ROS (+)  Hematology negative hematology ROS (+)   Anesthesia Other Findings Hypertension  Obesity Hyperlipidemia  Basal cell carcinoma Coronary artery disease  Ultrasound carotids: mild bilateral carotid artery disease Seizures, on meds   Reproductive/Obstetrics negative OB ROS                              Anesthesia Physical Anesthesia Plan  ASA: 3  Anesthesia Plan: General   Post-op Pain Management:    Induction: Intravenous  PONV Risk Score and Plan:   Airway Management Planned: Natural Airway and Nasal Cannula  Additional Equipment:   Intra-op Plan:   Post-operative Plan:   Informed Consent: I have reviewed the  patients History and Physical, chart, labs and discussed the procedure including the risks, benefits and alternatives for the proposed anesthesia with the patient or authorized representative who has indicated his/her understanding and acceptance.     Dental Advisory Given  Plan Discussed with: Anesthesiologist, CRNA and Surgeon  Anesthesia Plan Comments: (Patient consented for risks of anesthesia including but not limited to:  - adverse reactions to medications - risk of airway placement if required - damage to eyes, teeth, lips or other oral mucosa - nerve damage due to positioning  - sore throat or hoarseness - Damage to heart, brain, nerves, lungs, other parts of body or loss of life  Patient voiced understanding.)         Anesthesia Quick Evaluation

## 2023-03-27 ENCOUNTER — Ambulatory Visit
Admission: RE | Admit: 2023-03-27 | Discharge: 2023-03-27 | Disposition: A | Discharge status: Home / Self Care | Payer: Medicare HMO | Attending: Gastroenterology | Admitting: Gastroenterology

## 2023-03-27 ENCOUNTER — Ambulatory Visit: Payer: Medicare HMO | Admitting: Anesthesiology

## 2023-03-27 ENCOUNTER — Other Ambulatory Visit: Payer: Self-pay

## 2023-03-27 ENCOUNTER — Encounter
Admission: RE | Disposition: A | Discharge status: Home / Self Care | Payer: Self-pay | Source: Home / Self Care | Attending: Gastroenterology

## 2023-03-27 ENCOUNTER — Encounter: Payer: Self-pay | Admitting: Gastroenterology

## 2023-03-27 DIAGNOSIS — G4733 Obstructive sleep apnea (adult) (pediatric): Secondary | ICD-10-CM | POA: Insufficient documentation

## 2023-03-27 DIAGNOSIS — K640 First degree hemorrhoids: Secondary | ICD-10-CM | POA: Insufficient documentation

## 2023-03-27 DIAGNOSIS — I10 Essential (primary) hypertension: Secondary | ICD-10-CM | POA: Insufficient documentation

## 2023-03-27 DIAGNOSIS — I2510 Atherosclerotic heart disease of native coronary artery without angina pectoris: Secondary | ICD-10-CM | POA: Diagnosis not present

## 2023-03-27 DIAGNOSIS — R569 Unspecified convulsions: Secondary | ICD-10-CM | POA: Insufficient documentation

## 2023-03-27 DIAGNOSIS — Z860100 Personal history of colon polyps, unspecified: Secondary | ICD-10-CM

## 2023-03-27 DIAGNOSIS — G9389 Other specified disorders of brain: Secondary | ICD-10-CM | POA: Diagnosis not present

## 2023-03-27 DIAGNOSIS — Z1211 Encounter for screening for malignant neoplasm of colon: Secondary | ICD-10-CM | POA: Insufficient documentation

## 2023-03-27 DIAGNOSIS — Z09 Encounter for follow-up examination after completed treatment for conditions other than malignant neoplasm: Secondary | ICD-10-CM | POA: Diagnosis not present

## 2023-03-27 DIAGNOSIS — K5730 Diverticulosis of large intestine without perforation or abscess without bleeding: Secondary | ICD-10-CM | POA: Diagnosis not present

## 2023-03-27 HISTORY — DX: Atherosclerotic heart disease of native coronary artery without angina pectoris: I25.10

## 2023-03-27 HISTORY — PX: COLONOSCOPY WITH PROPOFOL: SHX5780

## 2023-03-27 HISTORY — DX: Unspecified convulsions: R56.9

## 2023-03-27 SURGERY — COLONOSCOPY WITH PROPOFOL
Anesthesia: General | Site: Rectum

## 2023-03-27 MED ORDER — SODIUM CHLORIDE 0.9 % IV SOLN: INTRAVENOUS | Status: DC

## 2023-03-27 MED ORDER — STERILE WATER FOR IRRIGATION IR SOLN
Status: DC | PRN
  Administered 2023-03-27: 1000 mL

## 2023-03-27 MED ORDER — PROPOFOL 10 MG/ML IV BOLUS
INTRAVENOUS | Status: DC | PRN
  Administered 2023-03-27: 50 mg via INTRAVENOUS
  Administered 2023-03-27: 100 mg via INTRAVENOUS
  Administered 2023-03-27: 25 mg via INTRAVENOUS
  Administered 2023-03-27: 50 mg via INTRAVENOUS

## 2023-03-27 MED ORDER — LACTATED RINGERS IV SOLN: INTRAVENOUS | Status: DC

## 2023-03-27 SURGICAL SUPPLY — 21 items

## 2023-03-27 NOTE — Op Note (Signed)
[facility] Gastroenterology Patient Name: Heather Dean Procedure Date: 03/27/2023 8:59 AM MRN: [id number] Account #: [id number] Date of Birth: Dec 11, 1955 Admit Type: Outpatient Age: 67 Room: [facility] OR ROOM 01 Gender: Female Note Status: Finalized Instrument Name: [id number] Procedure:             Colonoscopy Indications:           High risk colon cancer surveillance: Personal history                         of colonic polyps Providers:             Midge Minium MD, MD Referring MD:          Bari Edward, MD (Referring MD) Medicines:             Propofol per Anesthesia Complications:         No immediate complications. Procedure:             Pre-Anesthesia Assessment:                        - Prior to the procedure, a History and Physical was                         performed, and patient medications and allergies were                         reviewed. The patient's tolerance of previous                         anesthesia was also reviewed. The risks and benefits                         of the procedure and the sedation options and risks                         were discussed with the patient. All questions were                         answered, and informed consent was obtained. Prior                         Anticoagulants: The patient has taken no anticoagulant                         or antiplatelet agents. ASA Grade Assessment: II - A                         patient with mild systemic disease. After reviewing                         the risks and benefits, the patient was deemed in                         satisfactory condition to undergo the procedure.                        After obtaining informed consent, the colonoscope was  passed under direct vision. Throughout the procedure,                         the patient's blood pressure, pulse, and oxygen                         saturations were monitored continuously. The                          Colonoscope was introduced through the anus and                         advanced to the the cecum, identified by appendiceal                         orifice and ileocecal valve. The colonoscopy was                         performed without difficulty. The patient tolerated                         the procedure well. The quality of the bowel                         preparation was excellent. Findings:      The perianal and digital rectal examinations were normal.      A few small-mouthed diverticula were found in the sigmoid colon.      Non-bleeding internal hemorrhoids were found during retroflexion. The       hemorrhoids were Grade I (internal hemorrhoids that do not prolapse). Impression:            - Diverticulosis in the sigmoid colon.                        - Non-bleeding internal hemorrhoids.                        - No specimens collected. Recommendation:        - Discharge patient to home.                        - Resume previous diet.                        - Continue present medications.                        - Repeat colonoscopy in 7 years for surveillance. Procedure Code(s):     --- Professional ---                        [phone], Colonoscopy, flexible; diagnostic, including                         collection of specimen(s) by brushing or washing, when                         performed (separate procedure) Diagnosis Code(s):     --- Professional ---  Z86.010, Personal history of colonic polyps CPT copyright 2022 American Medical Association. All rights reserved. The codes documented in this report are preliminary and upon coder review may  be revised to meet current compliance requirements. Midge Minium MD, MD 03/27/2023 9:34:34 AM This report has been signed electronically. Number of Addenda: 0 Note Initiated On: 03/27/2023 8:59 AM Scope Withdrawal Time: 0 hours 7 minutes 28 seconds  Total Procedure Duration: 0 hours 10 minutes 57 seconds  Estimated  Blood Loss:  Estimated blood loss: none.      [facility]

## 2023-03-27 NOTE — Transfer of Care (Signed)
Immediate Anesthesia Transfer of Care Note  Patient: Heather Dean  Procedure(s) Performed: COLONOSCOPY WITH PROPOFOL (Rectum)  Patient Location: PACU  Anesthesia Type: General  Level of Consciousness: awake, alert  and patient cooperative  Airway and Oxygen Therapy: Patient Spontanous Breathing and Patient connected to supplemental oxygen  Post-op Assessment: Post-op Vital signs reviewed, Patient's Cardiovascular Status Stable, Respiratory Function Stable, Patent Airway and No signs of Nausea or vomiting  Post-op Vital Signs: Reviewed and stable  Complications: No notable events documented.

## 2023-03-27 NOTE — H&P (Signed)
Midge Minium, MD [facility] [address], [address] [place], [place] [address] [phone] Fax : [phone]  Primary Care Physician:  Reubin Milan, MD Primary Gastroenterologist:  Dr. Servando Snare  Pre-Procedure History & Physical: HPI:  Heather Dean is a 67 y.o. female is here for an colonoscopy.   Past Medical History:  Diagnosis Date   Basal cell carcinoma    Coronary artery disease    Hyperlipidemia    Hypertension    Obesity    Seizures (HCC)     Past Surgical History:  Procedure Laterality Date   BREAST CYST ASPIRATION Left    BREAST EXCISIONAL BIOPSY Right    CAROTID PTA/STENT INTERVENTION Left 05/18/2022   Procedure: CAROTID PTA/STENT INTERVENTION;  Surgeon: Annice Needy, MD;  Location: [facility] INVASIVE CV LAB;  Service: Cardiovascular;  Laterality: Left;   COLONOSCOPY     COLONOSCOPY WITH PROPOFOL N/A 04/01/2016   Procedure: COLONOSCOPY WITH PROPOFOL;  Surgeon: Scot Jun, MD;  Location: [facility] ENDOSCOPY;  Service: Endoscopy;  Laterality: N/A;   TONSILLECTOMY     TUBAL LIGATION      Prior to Admission medications   Medication Sig Start Date End Date Taking? Authorizing Provider  aspirin 81 MG chewable tablet Chew 81 mg by mouth daily.   Yes [provider]  Bioflavonoid Products (BIOFLEX) TABS Take 1 each by mouth.   Yes [provider]  clopidogrel (PLAVIX) 75 MG tablet TAKE 1 TABLET BY MOUTH EVERY DAY 08/23/22  Yes Georgiana Spinner, NP  fexofenadine [facility] ALLERGY) 180 MG tablet Take 1 tablet (180 mg total) by mouth daily. 02/01/23  Yes Reubin Milan, MD  fluticasone [facility]) 50 MCG/ACT nasal spray SPRAY 2 SPRAYS INTO EACH NOSTRIL EVERY DAY 02/16/23  Yes Reubin Milan, MD  levETIRAcetam (KEPPRA) 750 MG tablet TAKE 1 TABLET BY MOUTH TWICE A DAY 02/16/23  Yes Reubin Milan, MD  lisinopril (ZESTRIL) 20 MG tablet Take 1 tablet (20 mg total) by mouth daily. 02/01/23 02/01/24 Yes Reubin Milan, MD  montelukast (SINGULAIR) 10 MG tablet  TAKE 1 TABLET BY MOUTH EVERYDAY AT BEDTIME 01/19/23  Yes Reubin Milan, MD  Multiple Vitamin (MULTIVITAMIN ADULT PO) Take by mouth. Centrum Silver   Yes [provider]  rosuvastatin (CRESTOR) 10 MG tablet TAKE 1 TABLET BY MOUTH EVERY DAY 11/17/22  Yes Reubin Milan, MD    Allergies as of 02/06/2023   (No Known Allergies)    Family History  Problem Relation Age of Onset   Hypertension Mother    Heart disease Father    Melanoma Maternal Aunt    Breast cancer Paternal Aunt    Hypertension Maternal Grandmother    Hypertension Maternal Grandfather    Cancer Maternal Grandfather    Hypertension Brother     Social History   Socioeconomic History   Marital status: Divorced    Spouse name: Not on file   Number of children: 0   Years of education: Not on file   Highest education level: Not on file  Occupational History   Not on file  Tobacco Use   Smoking status: Never   Smokeless tobacco: Never  Vaping Use   Vaping Use: Never used  Substance and Sexual Activity   Alcohol use: Not Currently   Drug use: No   Sexual activity: Not Currently    Birth control/protection: None  Other Topics Concern   Not on file  Social History Narrative   Pt lives alone with her pets:  4 cats, 2 dogs, and 1 parrot.   Social Determinants of Health   Financial Resource Strain: Low Risk  (01/19/2023)   Overall Financial Resource Strain (CARDIA)    Difficulty of Paying Living Expenses: Not hard at all  Food Insecurity: No Food Insecurity (01/19/2023)   Hunger Vital Sign    Worried About Running Out of Food in the Last Year: Never true    Ran Out of Food in the Last Year: Never true  Transportation Needs: No Transportation Needs (01/19/2023)   PRAPARE - Administrator, Civil Service (Medical): No    Lack of Transportation (Non-Medical): No  Physical Activity: Insufficiently Active (01/19/2023)   Exercise Vital Sign    Days of Exercise per Week: 5 days    Minutes of  Exercise per Session: 20 min  Stress: No Stress Concern Present (01/19/2023)   [employer] of Occupational Health - Occupational Stress Questionnaire    Feeling of Stress : Not at all  Social Connections: Moderately Isolated (01/19/2023)   Social Connection and Isolation Panel [NHANES]    Frequency of Communication with Friends and Family: More than three times a week    Frequency of Social Gatherings with Friends and Family: More than three times a week    Attends Religious Services: More than 4 times per year    Active Member of [employer] or Organizations: No    Attends Banker Meetings: Never    Marital Status: Divorced  Catering manager Violence: Not At Risk (01/19/2023)   Humiliation, Afraid, Rape, and Kick questionnaire    Fear of Current or Ex-Partner: No    Emotionally Abused: No    Physically Abused: No    Sexually Abused: No    Review of Systems: See HPI, otherwise negative ROS  Physical Exam: BP 118/68   Temp 97.9 F (36.6 C) (Temporal)   Ht 5' 2.01" (1.575 m)   Wt 72.5 kg   SpO2 98%   BMI 29.22 kg/m  General:   Alert,  pleasant and cooperative in NAD Head:  Normocephalic and atraumatic. Neck:  Supple; no masses or thyromegaly. Lungs:  Clear throughout to auscultation.    Heart:  Regular rate and rhythm. Abdomen:  Soft, nontender and nondistended. Normal bowel sounds, without guarding, and without rebound.   Neurologic:  Alert and  oriented x4;  grossly normal neurologically.  Impression/Plan: Heather Dean is here for an colonoscopy to be performed for a history of adenomatous polyps on 2017   Risks, benefits, limitations, and alternatives regarding  colonoscopy have been reviewed with the patient.  Questions have been answered.  All parties agreeable.   Midge Minium, MD  03/27/2023, 8:34 AM

## 2023-03-27 NOTE — Anesthesia Postprocedure Evaluation (Signed)
Anesthesia Post Note  Patient: Heather Dean  Procedure(s) Performed: COLONOSCOPY WITH PROPOFOL (Rectum)  Patient location during evaluation: PACU Anesthesia Type: General Level of consciousness: awake and alert Pain management: pain level controlled Vital Signs Assessment: post-procedure vital signs reviewed and stable Respiratory status: spontaneous breathing, nonlabored ventilation, respiratory function stable and patient connected to nasal cannula oxygen Cardiovascular status: blood pressure returned to baseline and stable Postop Assessment: no apparent nausea or vomiting Anesthetic complications: no   No notable events documented.   Last Vitals:  Vitals:   03/27/23 0938 03/27/23 0944  BP: (!) 81/45 (!) 103/53  Pulse:  76  Resp: (!) 22 (!) 24  Temp: 36.6 C   SpO2: 96% 96%    Last Pain:  Vitals:   03/27/23 0944  TempSrc:   PainSc: 0-No pain                 Marisue Humble

## 2023-03-28 ENCOUNTER — Encounter: Payer: Self-pay | Admitting: Gastroenterology

## 2023-03-31 ENCOUNTER — Ambulatory Visit (INDEPENDENT_AMBULATORY_CARE_PROVIDER_SITE_OTHER): Payer: Medicare HMO

## 2023-03-31 ENCOUNTER — Ambulatory Visit (INDEPENDENT_AMBULATORY_CARE_PROVIDER_SITE_OTHER): Payer: Medicare HMO | Admitting: Vascular Surgery

## 2023-03-31 DIAGNOSIS — I6523 Occlusion and stenosis of bilateral carotid arteries: Secondary | ICD-10-CM

## 2023-04-17 DIAGNOSIS — R0689 Other abnormalities of breathing: Secondary | ICD-10-CM | POA: Diagnosis not present

## 2023-04-17 DIAGNOSIS — I959 Hypotension, unspecified: Secondary | ICD-10-CM | POA: Diagnosis not present

## 2023-04-17 DIAGNOSIS — R569 Unspecified convulsions: Secondary | ICD-10-CM | POA: Diagnosis not present

## 2023-04-17 DIAGNOSIS — Z5181 Encounter for therapeutic drug level monitoring: Secondary | ICD-10-CM | POA: Diagnosis not present

## 2023-04-17 DIAGNOSIS — Y908 Blood alcohol level of 240 mg/100 ml or more: Secondary | ICD-10-CM | POA: Diagnosis not present

## 2023-04-17 DIAGNOSIS — Z79899 Other long term (current) drug therapy: Secondary | ICD-10-CM | POA: Diagnosis not present

## 2023-04-17 DIAGNOSIS — G40802 Other epilepsy, not intractable, without status epilepticus: Secondary | ICD-10-CM | POA: Diagnosis not present

## 2023-04-17 DIAGNOSIS — F10120 Alcohol abuse with intoxication, uncomplicated: Secondary | ICD-10-CM | POA: Diagnosis not present

## 2023-04-17 DIAGNOSIS — R531 Weakness: Secondary | ICD-10-CM | POA: Diagnosis not present

## 2023-04-17 DIAGNOSIS — I10 Essential (primary) hypertension: Secondary | ICD-10-CM | POA: Diagnosis not present

## 2023-04-19 ENCOUNTER — Telehealth: Payer: Self-pay

## 2023-04-19 NOTE — Transitions of Care (Post Inpatient/ED Visit) (Signed)
   04/19/2023  Name: Heather Dean MRN: [id number] DOB: 1956/05/03  Today's TOC FU Call Status: Today's TOC FU Call Status:: Successful TOC FU Call Competed TOC FU Call Complete Date: 04/19/23  Transition Care Management Follow-up Telephone Call Date of Discharge: 04/18/23 Discharge Facility: Other ([facility]) Name of Other ([facility]) Discharge Facility: [facility] Type of Discharge: Emergency Department Reason for ED Visit: Other: (Seizure) How have you been since you were released from the hospital?: Better Any questions or concerns?: No  Items Reviewed: Did you receive and understand the discharge instructions provided?: Yes Medications obtained,verified, and reconciled?: Yes (Medications Reviewed) Any new allergies since your discharge?: No Dietary orders reviewed?: Yes Do you have support at home?: Yes  Medications Reviewed Today: Medications Reviewed Today     Reviewed by Virl Diamond, RN (Registered Nurse) on 03/27/23 at 0827  Med List Status: <None>   Medication Order Taking? Sig Documenting Provider Last Dose Status Informant  aspirin 81 MG chewable tablet [id number] Yes Chew 81 mg by mouth daily. [provider] Past Week Active Self  Bioflavonoid Products [facility]) TABS [id number] Yes Take 1 each by mouth. [provider] 03/26/2023 Active   clopidogrel (PLAVIX) 75 MG tablet [id number] Yes TAKE 1 TABLET BY MOUTH EVERY DAY Georgiana Spinner, NP Past Week Active   fexofenadine [facility] ALLERGY) 180 MG tablet [id number] Yes Take 1 tablet (180 mg total) by mouth daily. Reubin Milan, MD 03/26/2023 Active   fluticasone (FLONASE) 50 MCG/ACT nasal spray [id number] Yes SPRAY 2 SPRAYS INTO EACH NOSTRIL EVERY DAY Reubin Milan, MD Past Week Active   levETIRAcetam (KEPPRA) 750 MG tablet [id number] Yes TAKE 1 TABLET BY MOUTH TWICE A DAY Reubin Milan, MD 03/27/2023 Active   lisinopril (ZESTRIL) 20 MG tablet [id number] Yes Take 1 tablet (20 mg total) by mouth  daily. Reubin Milan, MD 03/27/2023 Active   montelukast (SINGULAIR) 10 MG tablet [id number] Yes TAKE 1 TABLET BY MOUTH EVERYDAY AT BEDTIME Reubin Milan, MD 03/26/2023 Active   Multiple Vitamin (MULTIVITAMIN ADULT PO) [id number] Yes Take by mouth. Centrum Silver [provider] Past Week Active   rosuvastatin (CRESTOR) 10 MG tablet [id number] Yes TAKE 1 TABLET BY MOUTH EVERY DAY Reubin Milan, MD 03/27/2023 Active             Home Care and Equipment/Supplies: Were Home Health Services Ordered?: NA Any new equipment or medical supplies ordered?: NA  Functional Questionnaire: Do you need assistance with bathing/showering or dressing?: No Do you need assistance with meal preparation?: No Do you need assistance with eating?: No Do you have difficulty maintaining continence: No Do you need assistance with getting out of bed/getting out of a chair/moving?: No Do you have difficulty managing or taking your medications?: No  Follow up appointments reviewed: PCP Follow-up appointment confirmed?: No (declined) MD Provider Line Number:[phone] Given: Yes Specialist Hospital Follow-up appointment confirmed?: Yes Date of Specialist follow-up appointment?: 05/04/23 Follow-Up Specialty Provider:: neurologist Do you need transportation to your follow-up appointment?: No Do you understand care options if your condition(s) worsen?: Yes-patient verbalized understanding    SIGNATURE  Woodfin Ganja LPN [facility] Nurse Health Advisor Direct Dial [phone]

## 2023-04-20 ENCOUNTER — Other Ambulatory Visit: Payer: Self-pay | Admitting: Internal Medicine

## 2023-04-20 DIAGNOSIS — R053 Chronic cough: Secondary | ICD-10-CM

## 2023-04-20 NOTE — Telephone Encounter (Signed)
ERROR

## 2023-05-17 ENCOUNTER — Other Ambulatory Visit: Payer: Self-pay | Admitting: Internal Medicine

## 2023-05-17 DIAGNOSIS — R569 Unspecified convulsions: Secondary | ICD-10-CM | POA: Diagnosis not present

## 2023-05-17 DIAGNOSIS — E782 Mixed hyperlipidemia: Secondary | ICD-10-CM

## 2023-06-08 ENCOUNTER — Telehealth: Payer: Self-pay | Admitting: Internal Medicine

## 2023-06-08 NOTE — Telephone Encounter (Signed)
Pt is calling in because she has an itchy rash on her hand that she got from out in her yard. Pt says she has had it before and was given medication for it and wants to know can she get the medication again.

## 2023-06-09 NOTE — Telephone Encounter (Signed)
Pt needs an appointment.  KP

## 2023-06-09 NOTE — Telephone Encounter (Signed)
Please review.  KP

## 2023-06-13 DIAGNOSIS — L578 Other skin changes due to chronic exposure to nonionizing radiation: Secondary | ICD-10-CM | POA: Diagnosis not present

## 2023-06-13 DIAGNOSIS — Z86018 Personal history of other benign neoplasm: Secondary | ICD-10-CM | POA: Diagnosis not present

## 2023-06-13 DIAGNOSIS — Z872 Personal history of diseases of the skin and subcutaneous tissue: Secondary | ICD-10-CM | POA: Diagnosis not present

## 2023-06-13 DIAGNOSIS — L718 Other rosacea: Secondary | ICD-10-CM | POA: Diagnosis not present

## 2023-06-13 DIAGNOSIS — L255 Unspecified contact dermatitis due to plants, except food: Secondary | ICD-10-CM | POA: Diagnosis not present

## 2023-06-13 DIAGNOSIS — Z808 Family history of malignant neoplasm of other organs or systems: Secondary | ICD-10-CM | POA: Diagnosis not present

## 2023-06-13 DIAGNOSIS — Z85828 Personal history of other malignant neoplasm of skin: Secondary | ICD-10-CM | POA: Diagnosis not present

## 2023-06-24 DIAGNOSIS — R569 Unspecified convulsions: Secondary | ICD-10-CM | POA: Diagnosis not present

## 2023-07-07 ENCOUNTER — Other Ambulatory Visit: Payer: Self-pay | Admitting: Internal Medicine

## 2023-07-07 DIAGNOSIS — R058 Other specified cough: Secondary | ICD-10-CM

## 2023-07-07 DIAGNOSIS — R569 Unspecified convulsions: Secondary | ICD-10-CM

## 2023-07-17 DIAGNOSIS — G939 Disorder of brain, unspecified: Secondary | ICD-10-CM | POA: Diagnosis not present

## 2023-07-17 DIAGNOSIS — Z87898 Personal history of other specified conditions: Secondary | ICD-10-CM | POA: Diagnosis not present

## 2023-07-17 DIAGNOSIS — I610 Nontraumatic intracerebral hemorrhage in hemisphere, subcortical: Secondary | ICD-10-CM | POA: Diagnosis not present

## 2023-07-18 ENCOUNTER — Encounter: Payer: Self-pay | Admitting: Internal Medicine

## 2023-07-18 ENCOUNTER — Ambulatory Visit (INDEPENDENT_AMBULATORY_CARE_PROVIDER_SITE_OTHER): Payer: Medicare HMO | Admitting: Internal Medicine

## 2023-07-18 VITALS — BP 122/70 | HR 79 | Ht 62.01 in | Wt 167.6 lb

## 2023-07-18 DIAGNOSIS — I6522 Occlusion and stenosis of left carotid artery: Secondary | ICD-10-CM

## 2023-07-18 DIAGNOSIS — I10 Essential (primary) hypertension: Secondary | ICD-10-CM

## 2023-07-18 DIAGNOSIS — R569 Unspecified convulsions: Secondary | ICD-10-CM

## 2023-07-18 DIAGNOSIS — Z23 Encounter for immunization: Secondary | ICD-10-CM | POA: Diagnosis not present

## 2023-07-18 MED ORDER — CLOPIDOGREL BISULFATE 75 MG PO TABS: 75.0000 mg | ORAL_TABLET | Freq: Every day | ORAL | 2 refills | Status: DC

## 2023-07-18 NOTE — Assessment & Plan Note (Signed)
Recent neurology visit with Normal EEG Now weaning off of Keppra

## 2023-07-18 NOTE — Assessment & Plan Note (Addendum)
Normal exam with stable BP on lisinopril.  Normal at home. No concerns or side effects to current medication. No change in regimen; continue low sodium diet.

## 2023-07-18 NOTE — Patient Instructions (Signed)
Call [facility] to schedule your mammogram at [phone].  

## 2023-07-18 NOTE — Progress Notes (Signed)
Date:  07/18/2023   Name:  Heather Dean   DOB:  08-Sep-1956   MRN:  [id number]   Chief Complaint: Hypertension  Hypertension This is a chronic problem. The problem is unchanged. The problem is controlled. Pertinent negatives include no blurred vision, chest pain, headaches, peripheral edema or shortness of breath. There are no associated agents to hypertension. Past treatments include ACE inhibitors.  Seizure disorder after carotid stenting - no recent seizures,  EEG was normal so neurology instructed her to taper and d/c Keppra.  She does continue on Plavix and is seeing VS in December.  Review of Systems  Eyes:  Negative for blurred vision.  Respiratory:  Negative for shortness of breath.   Cardiovascular:  Negative for chest pain.  Neurological:  Negative for headaches.     Lab Results  Component Value Date   NA 142 02/01/2023   K 4.4 02/01/2023   CO2 24 02/01/2023   GLUCOSE 103 (H) 02/01/2023   BUN 17 02/01/2023   CREATININE 0.62 02/01/2023   CALCIUM 10.1 02/01/2023   EGFR 97 02/01/2023   GFRNONAA >60 05/19/2022   Lab Results  Component Value Date   CHOL 179 02/01/2023   HDL 49 02/01/2023   LDLCALC 113 (H) 02/01/2023   TRIG 93 02/01/2023   CHOLHDL 3.7 02/01/2023   Lab Results  Component Value Date   TSH 1.360 02/01/2023   Lab Results  Component Value Date   HGBA1C 5.5 01/28/2022   Lab Results  Component Value Date   WBC 4.5 02/01/2023   HGB 13.9 02/01/2023   HCT 41.6 02/01/2023   MCV 84 02/01/2023   PLT 254 02/01/2023   Lab Results  Component Value Date   ALT 13 02/01/2023   AST 17 02/01/2023   ALKPHOS 62 02/01/2023   BILITOT 0.3 02/01/2023   No results found for: "25OHVITD2", "25OHVITD3", "VD25OH"   Patient Active Problem List   Diagnosis Date Noted   History of colonic polyps 03/27/2023   OSA (obstructive sleep apnea) 02/01/2023   Acquired thrombophilia (HCC) 02/01/2023   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)  05/21/2022   Seizure (HCC) 05/21/2022   Carotid stenosis, left 05/18/2022   Mixed hyperlipidemia 01/30/2022   Bruit of right carotid artery 01/28/2022   Abnormal mammogram 01/28/2022   Allergic cough 08/18/2021   Essential hypertension 08/18/2021    No Known Allergies  Past Surgical History:  Procedure Laterality Date   BREAST CYST ASPIRATION Left    BREAST EXCISIONAL BIOPSY Right    CAROTID PTA/STENT INTERVENTION Left 05/18/2022   Procedure: CAROTID PTA/STENT INTERVENTION;  Surgeon: Annice Needy, MD;  Location: [facility] INVASIVE CV LAB;  Service: Cardiovascular;  Laterality: Left;   COLONOSCOPY     COLONOSCOPY WITH PROPOFOL N/A 04/01/2016   Procedure: COLONOSCOPY WITH PROPOFOL;  Surgeon: Scot Jun, MD;  Location[facility] ENDOSCOPY;  Service: Endoscopy;  Laterality: N/A;   COLONOSCOPY WITH PROPOFOL N/A 03/27/2023   Procedure: COLONOSCOPY WITH PROPOFOL;  Surgeon: Midge Minium, MD;  Location: [facility] SURGERY CNTR;  Service: Endoscopy;  Laterality: N/A;   TONSILLECTOMY     TUBAL LIGATION      Social History   Tobacco Use   Smoking status: Never   Smokeless tobacco: Never  Vaping Use   Vaping status: Never Used  Substance Use Topics   Alcohol use: Not Currently   Drug use: No     Medication list has been reviewed and updated.  Current Meds  Medication Sig  aspirin 81 MG chewable tablet Chew 81 mg by mouth daily.   Bioflavonoid Products (BIOFLEX) TABS Take 1 each by mouth.   fluticasone (FLONASE) 50 MCG/ACT nasal spray SPRAY 2 SPRAYS INTO EACH NOSTRIL EVERY DAY   levETIRAcetam (KEPPRA) 750 MG tablet TAKE 1 TABLET BY MOUTH TWICE A DAY   lisinopril (ZESTRIL) 20 MG tablet Take 1 tablet (20 mg total) by mouth daily.   montelukast (SINGULAIR) 10 MG tablet TAKE 1 TABLET BY MOUTH EVERYDAY AT BEDTIME   Multiple Vitamin (MULTIVITAMIN ADULT PO) Take by mouth. Centrum Silver   rosuvastatin (CRESTOR) 10 MG tablet TAKE 1 TABLET BY MOUTH EVERY DAY   [DISCONTINUED] clopidogrel (PLAVIX)  75 MG tablet TAKE 1 TABLET BY MOUTH EVERY DAY       07/18/2023    9:27 AM 02/13/2023    8:56 AM 02/01/2023    8:26 AM 07/07/2022   11:03 AM  GAD 7 : Generalized Anxiety Score  Nervous, Anxious, on Edge 0 0 0 0  Control/stop worrying 0 0 0 0  Worry too much - different things 0 0 0 0  Trouble relaxing 0 0 1 0  Restless 0 0 0 0  Easily annoyed or irritable 1 0 0 0  Afraid - awful might happen 0 0 0 0  Total GAD 7 Score 1 0 1 0  Anxiety Difficulty Not difficult at all Not difficult at all Not difficult at all Not difficult at all       07/18/2023    9:27 AM 02/13/2023    8:56 AM 02/01/2023    8:26 AM  Depression screen PHQ 2/9  Decreased Interest 0 0 0  Down, Depressed, Hopeless 0 0 0  PHQ - 2 Score 0 0 0  Altered sleeping 1 2 3   Tired, decreased energy 1 0 3  Change in appetite 0 0 1  Feeling bad or failure about yourself  0 0 0  Trouble concentrating 0 0 0  Moving slowly or fidgety/restless 0 0 0  Suicidal thoughts 0 0 0  PHQ-9 Score 2 2 7   Difficult doing work/chores Not difficult at all Not difficult at all Not difficult at all    BP Readings from Last 3 Encounters:  07/18/23 122/70  03/27/23 (!) 103/53  02/13/23 122/70    Physical Exam Vitals and nursing note reviewed.  Constitutional:      General: She is not in acute distress.    Appearance: She is well-developed.  HENT:     Head: Normocephalic and atraumatic.  Cardiovascular:     Rate and Rhythm: Normal rate and regular rhythm.  Pulmonary:     Effort: Pulmonary effort is normal. No respiratory distress.     Breath sounds: Normal breath sounds. No wheezing or rhonchi.  Musculoskeletal:        General: No swelling.     Cervical back: Normal range of motion.  Skin:    General: Skin is warm and dry.     Findings: No rash.  Neurological:     General: No focal deficit present.     Mental Status: She is alert and oriented to person, place, and time.  Psychiatric:        Mood and Affect: Mood normal.         Behavior: Behavior normal.     Wt Readings from Last 3 Encounters:  07/18/23 167 lb 9.6 oz (76 kg)  03/27/23 159 lb 12.8 oz (72.5 kg)  02/13/23 162 lb 9.6 oz (73.8 kg)  BP 122/70   Pulse 79   Ht 5' 2.01" (1.575 m)   Wt 167 lb 9.6 oz (76 kg)   SpO2 100%   BMI 30.64 kg/m   Assessment and Plan:  Problem List Items Addressed This Visit       Unprioritized   Essential hypertension - Primary (Chronic)    Normal exam with stable BP on lisinopril.  Normal at home. No concerns or side effects to current medication. No change in regimen; continue low sodium diet.       Seizure (HCC) (Chronic)    Recent neurology visit with Normal EEG Now weaning off of Keppra      Other Visit Diagnoses     Left carotid stenosis       Relevant Medications   clopidogrel (PLAVIX) 75 MG tablet   Need for influenza vaccination       Relevant Orders   Flu Vaccine Trivalent High Dose (Fluad) (Completed)       No follow-ups on file.    Reubin Milan, MD [facility] Primary [facility] Medicine [place]

## 2023-08-14 ENCOUNTER — Other Ambulatory Visit: Payer: Self-pay | Admitting: Internal Medicine

## 2023-08-14 DIAGNOSIS — R053 Chronic cough: Secondary | ICD-10-CM

## 2023-08-15 NOTE — Telephone Encounter (Signed)
Requested Prescriptions  Pending Prescriptions Disp Refills   montelukast (SINGULAIR) 10 MG tablet [Pharmacy Med Name: MONTELUKAST SOD 10 MG TABLET] 90 tablet 1    Sig: TAKE 1 TABLET BY MOUTH EVERYDAY AT BEDTIME     Pulmonology:  Leukotriene Inhibitors Passed - 08/14/2023  1:24 AM      Passed - Valid encounter within last 12 months    Recent Outpatient Visits           4 weeks ago Essential hypertension   [place] [facility] at [facility], [name], MD   6 months ago Allergic contact dermatitis due to plants, except food   [place] [facility] at [facility], [name], MD   6 months ago Annual physical exam   [facility] at [facility], [name], MD   1 year ago Essential hypertension   [place] [facility] at [facility], [name], MD   1 year ago Essential hypertension   [place] [facility] at [facility], [name], MD       Future Appointments             In 5 months [name], [name], MD [facility] at [facility], [facility]

## 2023-09-25 ENCOUNTER — Other Ambulatory Visit (INDEPENDENT_AMBULATORY_CARE_PROVIDER_SITE_OTHER): Payer: Self-pay | Admitting: Vascular Surgery

## 2023-09-25 DIAGNOSIS — I6523 Occlusion and stenosis of bilateral carotid arteries: Secondary | ICD-10-CM

## 2023-09-26 ENCOUNTER — Ambulatory Visit (INDEPENDENT_AMBULATORY_CARE_PROVIDER_SITE_OTHER): Payer: Medicare HMO | Admitting: Vascular Surgery

## 2023-09-26 ENCOUNTER — Ambulatory Visit (INDEPENDENT_AMBULATORY_CARE_PROVIDER_SITE_OTHER): Payer: Medicare HMO

## 2023-09-26 VITALS — BP 143/74 | HR 74 | Resp 16 | Wt 171.4 lb

## 2023-09-26 DIAGNOSIS — I610 Nontraumatic intracerebral hemorrhage in hemisphere, subcortical: Secondary | ICD-10-CM | POA: Diagnosis not present

## 2023-09-26 DIAGNOSIS — I6522 Occlusion and stenosis of left carotid artery: Secondary | ICD-10-CM

## 2023-09-26 DIAGNOSIS — I10 Essential (primary) hypertension: Secondary | ICD-10-CM

## 2023-09-26 DIAGNOSIS — I6523 Occlusion and stenosis of bilateral carotid arteries: Secondary | ICD-10-CM

## 2023-09-26 DIAGNOSIS — E782 Mixed hyperlipidemia: Secondary | ICD-10-CM | POA: Diagnosis not present

## 2023-09-26 NOTE — Progress Notes (Signed)
MRN : [id number]  Heather Dean is a 67 y.o. (November 14, 1955) female who presents with chief complaint of  Chief Complaint  Patient presents with   Follow-up    6 month carotid follow up  .  History of Present Illness: Patient returns today in follow up of her carotid disease.  Patient is doing well.  She has been able to come off of her seizure medication and follows with neurology.  She has had no focal neurologic symptoms.  She is over a year status post left carotid stent placement for high-grade stenosis. Carotid duplex today reveals 1 to 39% right ICA stenosis with minimal disease and a widely patent left carotid stent.   Current Outpatient Medications  Medication Sig Dispense Refill   aspirin 81 MG chewable tablet Chew 81 mg by mouth daily.     Bioflavonoid Products (BIOFLEX) TABS Take 1 each by mouth.     clopidogrel (PLAVIX) 75 MG tablet Take 1 tablet (75 mg total) by mouth daily. 90 tablet 2   fluticasone (FLONASE) 50 MCG/ACT nasal spray SPRAY 2 SPRAYS INTO EACH NOSTRIL EVERY DAY 48 mL 1   lisinopril (ZESTRIL) 20 MG tablet Take 1 tablet (20 mg total) by mouth daily. 90 tablet 3   montelukast (SINGULAIR) 10 MG tablet TAKE 1 TABLET BY MOUTH EVERYDAY AT BEDTIME 90 tablet 1   Multiple Vitamin (MULTIVITAMIN ADULT PO) Take by mouth. Centrum Silver     rosuvastatin (CRESTOR) 10 MG tablet TAKE 1 TABLET BY MOUTH EVERY DAY 90 tablet 1   levETIRAcetam (KEPPRA) 750 MG tablet TAKE 1 TABLET BY MOUTH TWICE A DAY 180 tablet 1   No current facility-administered medications for this visit.    Past Medical History:  Diagnosis Date   Basal cell carcinoma    Coronary artery disease    Hyperlipidemia    Hypertension    Obesity    Seizures (HCC)     Past Surgical History:  Procedure Laterality Date   BREAST CYST ASPIRATION Left    BREAST EXCISIONAL BIOPSY Right    CAROTID PTA/STENT INTERVENTION Left 05/18/2022   Procedure: CAROTID PTA/STENT INTERVENTION;  Surgeon: Annice Needy, MD;   Location: [facility] INVASIVE CV LAB;  Service: Cardiovascular;  Laterality: Left;   COLONOSCOPY     COLONOSCOPY WITH PROPOFOL N/A 04/01/2016   Procedure: COLONOSCOPY WITH PROPOFOL;  Surgeon: Scot Jun, MD;  Location[facility] ENDOSCOPY;  Service: Endoscopy;  Laterality: N/A;   COLONOSCOPY WITH PROPOFOL N/A 03/27/2023   Procedure: COLONOSCOPY WITH PROPOFOL;  Surgeon: Midge Minium, MD;  Location: [facility] SURGERY [facility];  Service: Endoscopy;  Laterality: N/A;   TONSILLECTOMY     TUBAL LIGATION       Social History   Tobacco Use   Smoking status: Never   Smokeless tobacco: Never  Vaping Use   Vaping status: Never Used  Substance Use Topics   Alcohol use: Not Currently   Drug use: No      Family History  Problem Relation Age of Onset   Hypertension Mother    Heart disease Father    Melanoma Maternal Aunt    Breast cancer Paternal Aunt    Hypertension Maternal Grandmother    Hypertension Maternal Grandfather    Cancer Maternal Grandfather    Hypertension Brother      No Known Allergies   REVIEW OF SYSTEMS (Negative unless checked)  Constitutional: [] Weight loss  [] Fever  [] Chills Cardiac: [] Chest pain   [] Chest pressure   [] Palpitations   [] Shortness of  breath when laying flat   [] Shortness of breath at rest   [] Shortness of breath with exertion. Vascular:  [] Pain in legs with walking   [] Pain in legs at rest   [] Pain in legs when laying flat   [] Claudication   [] Pain in feet when walking  [] Pain in feet at rest  [] Pain in feet when laying flat   [] History of DVT   [] Phlebitis   [] Swelling in legs   [] Varicose veins   [] Non-healing ulcers Pulmonary:   [] Uses home oxygen   [] Productive cough   [] Hemoptysis   [] Wheeze  [] COPD   [] Asthma Neurologic:  [] Dizziness  [] Blackouts   [x] Seizures   [] History of stroke   [] History of TIA  [] Aphasia   [] Temporary blindness   [] Dysphagia   [] Weakness or numbness in arms   [] Weakness or numbness in legs Musculoskeletal:  [] Arthritis   [] Joint  swelling   [] Joint pain   [] Low back pain Hematologic:  [] Easy bruising  [] Easy bleeding   [] Hypercoagulable state   [] Anemic   Gastrointestinal:  [] Blood in stool   [] Vomiting blood  [] Gastroesophageal reflux/heartburn   [] Abdominal pain Genitourinary:  [] Chronic kidney disease   [] Difficult urination  [] Frequent urination  [] Burning with urination   [] Hematuria Skin:  [] Rashes   [] Ulcers   [] Wounds Psychological:  [] History of anxiety   []  History of major depression.  Physical Examination  BP (!) 143/74   Pulse 74   Resp 16   Wt 171 lb 6.4 oz (77.7 kg)   BMI 31.34 kg/m  Gen:  WD/WN, NAD Head: [place]/AT, No temporalis wasting. Ear/Nose/Throat: Hearing grossly intact, nares w/o erythema or drainage Eyes: Conjunctiva clear. Sclera non-icteric Neck: Supple.  Trachea midline. No bruit Pulmonary:  Good air movement, no use of accessory muscles.  Cardiac: RRR, no JVD Vascular:  Vessel Right Left  Radial Palpable Palpable                   Musculoskeletal: M/S 5/5 throughout.  No deformity or atrophy. No edema. Neurologic: Sensation grossly intact in extremities.  Symmetrical.  Speech is fluent.  Psychiatric: Judgment intact, Mood & affect appropriate for pt's clinical situation. Dermatologic: No rashes or ulcers noted.  No cellulitis or open wounds.      Labs No results found for this or any previous visit (from the past 2160 hours).  Radiology No results found.  Assessment/Plan  Carotid stenosis, left Carotid duplex today reveals 1 to 39% right ICA stenosis with minimal disease and a widely patent left carotid stent.  Continue antiplatelet therapy and statin agent.  Follow-up annually at this point.  Nontraumatic subcortical hemorrhage of left cerebral hemisphere [facility]) Follows with neurology.  Now off of seizure medications   Essential hypertension blood pressure control important in reducing the progression of atherosclerotic disease. On appropriate oral medications.      Mixed hyperlipidemia lipid control important in reducing the progression of atherosclerotic disease. Continue statin therapy  Festus Barren, MD  09/26/2023 9:40 AM    This note was created with Dragon medical transcription system.  Any errors from dictation are purely unintentional

## 2023-09-26 NOTE — Assessment & Plan Note (Signed)
Carotid duplex today reveals 1 to 39% right ICA stenosis with minimal disease and a widely patent left carotid stent.  Continue antiplatelet therapy and statin agent.  Follow-up annually at this point.

## 2023-11-07 DIAGNOSIS — J029 Acute pharyngitis, unspecified: Secondary | ICD-10-CM | POA: Diagnosis not present

## 2023-11-07 DIAGNOSIS — J069 Acute upper respiratory infection, unspecified: Secondary | ICD-10-CM | POA: Diagnosis not present

## 2023-11-07 DIAGNOSIS — I10 Essential (primary) hypertension: Secondary | ICD-10-CM | POA: Diagnosis not present

## 2023-11-07 DIAGNOSIS — R051 Acute cough: Secondary | ICD-10-CM | POA: Diagnosis not present

## 2023-11-14 ENCOUNTER — Other Ambulatory Visit: Payer: Self-pay | Admitting: Internal Medicine

## 2023-11-14 DIAGNOSIS — E782 Mixed hyperlipidemia: Secondary | ICD-10-CM

## 2023-11-15 NOTE — Telephone Encounter (Signed)
 Requested Prescriptions  Pending Prescriptions Disp Refills   rosuvastatin  (CRESTOR ) 10 MG tablet [Pharmacy Med Name: ROSUVASTATIN  CALCIUM  10 MG TAB] 90 tablet 1    Sig: TAKE 1 TABLET BY MOUTH EVERY DAY     Cardiovascular:  Antilipid - Statins 2 Failed - 11/15/2023 11:38 AM      Failed - Lipid Panel in normal range within the last 12 months    Cholesterol, Total  Date Value Ref Range Status  02/01/2023 179 100 - 199 mg/dL Final   LDL Chol Calc (NIH)  Date Value Ref Range Status  02/01/2023 113 (H) 0 - 99 mg/dL Final   HDL  Date Value Ref Range Status  02/01/2023 49 >39 mg/dL Final   Triglycerides  Date Value Ref Range Status  02/01/2023 93 0 - 149 mg/dL Final         Passed - Cr in normal range and within 360 days    Creatinine, Ser  Date Value Ref Range Status  02/01/2023 0.62 0.57 - 1.00 mg/dL Final         Passed - Patient is not pregnant      Passed - Valid encounter within last 12 months    Recent Outpatient Visits           4 months ago Essential hypertension   [place] [facility] at [facility], Leita DEL, MD   9 months ago Allergic contact dermatitis due to plants, except food   [place] [facility] at [facility], Leita DEL, MD   9 months ago Annual physical exam   [facility] at [facility], Leita DEL, MD   1 year ago Essential hypertension   [place] [facility] at [facility], Leita DEL, MD   1 year ago Essential hypertension   [place] [facility] at [facility], Leita DEL, MD       Future Appointments             In 2 months Justus, Leita DEL, MD [facility] at [facility], [facility]

## 2024-02-05 ENCOUNTER — Encounter: Payer: Self-pay | Admitting: Internal Medicine

## 2024-02-14 ENCOUNTER — Other Ambulatory Visit: Payer: Self-pay | Admitting: Internal Medicine

## 2024-02-14 DIAGNOSIS — R053 Chronic cough: Secondary | ICD-10-CM

## 2024-02-14 DIAGNOSIS — I10 Essential (primary) hypertension: Secondary | ICD-10-CM

## 2024-02-16 NOTE — Telephone Encounter (Signed)
 Requested Prescriptions  Pending Prescriptions Disp Refills   lisinopril  (ZESTRIL ) 20 MG tablet [Pharmacy Med Name: LISINOPRIL  20 MG TABLET] 90 tablet 3    Sig: TAKE 1 TABLET BY MOUTH EVERY DAY     Cardiovascular:  ACE Inhibitors Failed - 02/16/2024  8:24 AM      Failed - Cr in normal range and within 180 days    Creatinine, Ser  Date Value Ref Range Status  02/01/2023 0.62 0.57 - 1.00 mg/dL Final         Failed - K in normal range and within 180 days    Potassium  Date Value Ref Range Status  02/01/2023 4.4 3.5 - 5.2 mmol/L Final         Failed - Last BP in normal range    BP Readings from Last 1 Encounters:  09/26/23 (!) 143/74         Failed - Valid encounter within last 6 months    Recent Outpatient Visits   None            Passed - Patient is not pregnant       montelukast  (SINGULAIR ) 10 MG tablet [Pharmacy Med Name: MONTELUKAST  SOD 10 MG TABLET] 90 tablet 1    Sig: TAKE 1 TABLET BY MOUTH EVERYDAY AT BEDTIME     Pulmonology:  Leukotriene Inhibitors Failed - 02/16/2024  8:24 AM      Failed - Valid encounter within last 12 months    Recent Outpatient Visits   None

## 2024-02-16 NOTE — Telephone Encounter (Signed)
 Med refill

## 2024-02-16 NOTE — Telephone Encounter (Signed)
 Requested medications are due for refill today.  yes  Requested medications are on the active medications list.  yes  Last refill. 02/01/2023 #90 3 rf  Future visit scheduled.   yes  Notes to clinic.  Rx written to expire 02/01/2024 - rx is expired.    Requested Prescriptions  Pending Prescriptions Disp Refills   lisinopril  (ZESTRIL ) 20 MG tablet [Pharmacy Med Name: LISINOPRIL  20 MG TABLET] 90 tablet 3    Sig: TAKE 1 TABLET BY MOUTH EVERY DAY     Cardiovascular:  ACE Inhibitors Failed - 02/16/2024  8:25 AM      Failed - Cr in normal range and within 180 days    Creatinine, Ser  Date Value Ref Range Status  02/01/2023 0.62 0.57 - 1.00 mg/dL Final         Failed - K in normal range and within 180 days    Potassium  Date Value Ref Range Status  02/01/2023 4.4 3.5 - 5.2 mmol/L Final         Failed - Last BP in normal range    BP Readings from Last 1 Encounters:  09/26/23 (!) 143/74         Failed - Valid encounter within last 6 months    Recent Outpatient Visits   None            Passed - Patient is not pregnant      Signed Prescriptions Disp Refills   montelukast  (SINGULAIR ) 10 MG tablet 90 tablet 0    Sig: TAKE 1 TABLET BY MOUTH EVERYDAY AT BEDTIME     Pulmonology:  Leukotriene Inhibitors Failed - 02/16/2024  8:25 AM      Failed - Valid encounter within last 12 months    Recent Outpatient Visits   None

## 2024-02-19 ENCOUNTER — Encounter: Payer: Self-pay | Admitting: Internal Medicine

## 2024-02-19 ENCOUNTER — Ambulatory Visit (INDEPENDENT_AMBULATORY_CARE_PROVIDER_SITE_OTHER): Admitting: Internal Medicine

## 2024-02-19 VITALS — BP 126/78 | HR 74 | Ht 62.0 in | Wt 175.0 lb

## 2024-02-19 DIAGNOSIS — Z1231 Encounter for screening mammogram for malignant neoplasm of breast: Secondary | ICD-10-CM | POA: Diagnosis not present

## 2024-02-19 DIAGNOSIS — I10 Essential (primary) hypertension: Secondary | ICD-10-CM

## 2024-02-19 DIAGNOSIS — Z87898 Personal history of other specified conditions: Secondary | ICD-10-CM | POA: Diagnosis not present

## 2024-02-19 DIAGNOSIS — E782 Mixed hyperlipidemia: Secondary | ICD-10-CM | POA: Diagnosis not present

## 2024-02-19 DIAGNOSIS — D6869 Other thrombophilia: Secondary | ICD-10-CM

## 2024-02-19 DIAGNOSIS — Z0000 Encounter for general adult medical examination without abnormal findings: Secondary | ICD-10-CM | POA: Diagnosis not present

## 2024-02-19 NOTE — Assessment & Plan Note (Signed)
 LDL is  Lab Results  Component Value Date   LDLCALC 113 (H) 02/01/2023   Current regimen is rosuvastatin  10 mg.  No medication side effects noted. Goal LDL is <70.

## 2024-02-19 NOTE — Assessment & Plan Note (Signed)
 On Plavix  after stroke. No bleeding issues noted. Followed by Neurology.

## 2024-02-19 NOTE — Progress Notes (Signed)
 Date:  02/19/2024   Name:  Heather Dean   DOB:  October 28, 1955   MRN:  [id number]   Chief Complaint: Annual Exam Heather Dean is a 68 y.o. female who presents today for her Complete Annual Exam. She feels well. She reports exercising walks  1 - 2 times a week, 10 - 15 minutes. She reports she is sleeping well. Breast complaints none.  Health Maintenance  Topic Date Due   Mammogram  12/24/2022   Medicare Annual Wellness Visit  01/19/2024   COVID-19 Vaccine (3 - Pfizer risk series) 03/06/2024*   Flu Shot  05/10/2024   Colon Cancer Screening  03/26/2030   DTaP/Tdap/Td vaccine (3 - Td or Tdap) 01/31/2033   Pneumonia Vaccine  Completed   DEXA scan (bone density measurement)  Completed   Hepatitis C Screening  Completed   Zoster (Shingles) Vaccine  Completed   HPV Vaccine  Aged Out   Meningitis B Vaccine  Aged Out  *Topic was postponed. The date shown is not the original due date.    Hypertension This is a chronic problem. The problem is controlled. Pertinent negatives include no chest pain, headaches, palpitations or shortness of breath. Past treatments include ACE inhibitors. The current treatment provides significant improvement. Hypertensive end-organ damage includes CVA (after CEA surgery).  Hyperlipidemia This is a chronic problem. The problem is controlled. Pertinent negatives include no chest pain, myalgias or shortness of breath. Current antihyperlipidemic treatment includes statins. The current treatment provides significant improvement of lipids.    Review of Systems  Constitutional:  Negative for fatigue and unexpected weight change.  HENT:  Negative for trouble swallowing.   Eyes:  Negative for visual disturbance.  Respiratory:  Negative for cough, chest tightness, shortness of breath and wheezing.   Cardiovascular:  Negative for chest pain, palpitations and leg swelling.  Gastrointestinal:  Negative for abdominal pain, constipation and diarrhea.  Musculoskeletal:   Negative for arthralgias and myalgias.  Neurological:  Negative for dizziness, weakness, light-headedness and headaches.     Lab Results  Component Value Date   NA 142 02/01/2023   K 4.4 02/01/2023   CO2 24 02/01/2023   GLUCOSE 103 (H) 02/01/2023   BUN 17 02/01/2023   CREATININE 0.62 02/01/2023   CALCIUM  10.1 02/01/2023   EGFR 97 02/01/2023   GFRNONAA >60 05/19/2022   Lab Results  Component Value Date   CHOL 179 02/01/2023   HDL 49 02/01/2023   LDLCALC 113 (H) 02/01/2023   TRIG 93 02/01/2023   CHOLHDL 3.7 02/01/2023   Lab Results  Component Value Date   TSH 1.360 02/01/2023   Lab Results  Component Value Date   HGBA1C 5.5 01/28/2022   Lab Results  Component Value Date   WBC 4.5 02/01/2023   HGB 13.9 02/01/2023   HCT 41.6 02/01/2023   MCV 84 02/01/2023   PLT 254 02/01/2023   Lab Results  Component Value Date   ALT 13 02/01/2023   AST 17 02/01/2023   ALKPHOS 62 02/01/2023   BILITOT 0.3 02/01/2023   No results found for: "25OHVITD2", "25OHVITD3", "VD25OH"   Patient Active Problem List   Diagnosis Date Noted   History of colonic polyps 03/27/2023   OSA (obstructive sleep apnea) 02/01/2023   Acquired thrombophilia (HCC) 02/01/2023   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) 05/21/2022   History of seizure 05/21/2022   Carotid stenosis, left 05/18/2022   Mixed hyperlipidemia 01/30/2022   Bruit of right carotid artery 01/28/2022  Abnormal mammogram 01/28/2022   Allergic cough 08/18/2021   Essential hypertension 08/18/2021    No Known Allergies  Past Surgical History:  Procedure Laterality Date   BREAST CYST ASPIRATION Left    BREAST EXCISIONAL BIOPSY Right    CAROTID PTA/STENT INTERVENTION Left 05/18/2022   Procedure: CAROTID PTA/STENT INTERVENTION;  Surgeon: Celso College, MD;  Location: [facility] INVASIVE CV [facility];  Service: Cardiovascular;  Laterality: Left;   COLONOSCOPY     COLONOSCOPY WITH PROPOFOL  N/A 04/01/2016   Procedure:  COLONOSCOPY WITH PROPOFOL ;  Surgeon: Cassie Click, MD;  Location: [facility] ENDOSCOPY;  Service: Endoscopy;  Laterality: N/A;   COLONOSCOPY WITH PROPOFOL  N/A 03/27/2023   Procedure: COLONOSCOPY WITH PROPOFOL ;  Surgeon: Marnee Sink, MD;  Location: [facility];  Service: Endoscopy;  Laterality: N/A;   TONSILLECTOMY     TUBAL LIGATION      Social History   Tobacco Use   Smoking status: Never   Smokeless tobacco: Never  Vaping Use   Vaping status: Never Used  Substance Use Topics   Alcohol use: Not Currently   Drug use: No     Medication list has been reviewed and updated.  Current Meds  Medication Sig   aspirin  81 MG chewable tablet Chew 81 mg by mouth daily.   Bioflavonoid Products (BIOFLEX) TABS Take 1 each by mouth.   clopidogrel  (PLAVIX ) 75 MG tablet Take 1 tablet (75 mg total) by mouth daily.   fluticasone  (FLONASE ) 50 MCG/ACT nasal spray SPRAY 2 SPRAYS INTO EACH NOSTRIL EVERY DAY   lisinopril  (ZESTRIL ) 20 MG tablet TAKE 1 TABLET BY MOUTH EVERY DAY   montelukast  (SINGULAIR ) 10 MG tablet TAKE 1 TABLET BY MOUTH EVERYDAY AT BEDTIME   Multiple Vitamin (MULTIVITAMIN ADULT PO) Take by mouth. Centrum Silver   rosuvastatin  (CRESTOR ) 10 MG tablet TAKE 1 TABLET BY MOUTH EVERY DAY   UNABLE TO FIND Take 5,000 mcg by mouth daily. Med Name: Nature's Bounty - HAIR, SKIN & NAILS supplement - patient reports taking 3 Softgels a day       02/19/2024    2:25 PM 07/18/2023    9:27 AM 02/13/2023    8:56 AM 02/01/2023    8:26 AM  GAD 7 : Generalized Anxiety Score  Nervous, Anxious, on Edge 0 0 0 0  Control/stop worrying 0 0 0 0  Worry too much - different things 0 0 0 0  Trouble relaxing 0 0 0 1  Restless 0 0 0 0  Easily annoyed or irritable 0 1 0 0  Afraid - awful might happen 0 0 0 0  Total GAD 7 Score 0 1 0 1  Anxiety Difficulty Not difficult at all Not difficult at all Not difficult at all Not difficult at all       02/19/2024    2:24 PM 07/18/2023    9:27 AM 02/13/2023    8:56  AM  Depression screen PHQ 2/9  Decreased Interest 0 0 0  Down, Depressed, Hopeless 0 0 0  PHQ - 2 Score 0 0 0  Altered sleeping 0 1 2  Tired, decreased energy 0 1 0  Change in appetite 0 0 0  Feeling bad or failure about yourself  0 0 0  Trouble concentrating 0 0 0  Moving slowly or fidgety/restless 0 0 0  Suicidal thoughts 0 0 0  PHQ-9 Score 0 2 2  Difficult doing work/chores Not difficult at all Not difficult at all Not difficult at all    BP Readings  from Last 3 Encounters:  02/19/24 126/78  09/26/23 (!) 143/74  07/18/23 122/70    Physical Exam Vitals and nursing note reviewed.  Constitutional:      General: She is not in acute distress.    Appearance: She is well-developed.  HENT:     Head: Normocephalic and atraumatic.     Right Ear: Tympanic membrane and ear canal normal.     Left Ear: Tympanic membrane and ear canal normal.     Nose:     Right Sinus: No maxillary sinus tenderness.     Left Sinus: No maxillary sinus tenderness.  Eyes:     General: No scleral icterus.       Right eye: No discharge.        Left eye: No discharge.     Conjunctiva/sclera: Conjunctivae normal.  Neck:     Thyroid: No thyromegaly.     Vascular: No carotid bruit.  Cardiovascular:     Rate and Rhythm: Normal rate and regular rhythm.     Pulses: Normal pulses.     Heart sounds: Normal heart sounds.  Pulmonary:     Effort: Pulmonary effort is normal. No respiratory distress.     Breath sounds: No wheezing.  Abdominal:     General: Bowel sounds are normal.     Palpations: Abdomen is soft.     Tenderness: There is no abdominal tenderness.  Musculoskeletal:     Cervical back: Normal range of motion. No erythema.     Right lower leg: No edema.     Left lower leg: No edema.  Lymphadenopathy:     Cervical: No cervical adenopathy.  Skin:    General: Skin is warm and dry.     Findings: No rash.  Neurological:     Mental Status: She is alert and oriented to person, place, and time.      Cranial Nerves: No cranial nerve deficit.     Sensory: No sensory deficit.     Deep Tendon Reflexes: Reflexes are normal and symmetric.  Psychiatric:        Attention and Perception: Attention normal.        Mood and Affect: Mood normal.     Wt Readings from Last 3 Encounters:  02/19/24 175 lb (79.4 kg)  09/26/23 171 lb 6.4 oz (77.7 kg)  07/18/23 167 lb 9.6 oz (76 kg)    BP 126/78   Pulse 74   Ht 5\' 2"  (1.575 m)   Wt 175 lb (79.4 kg)   SpO2 98%   BMI 32.01 kg/m   Assessment and Plan:  Problem List Items Addressed This Visit       Unprioritized   Essential hypertension (Chronic)   Blood pressure is well controlled.  Current medications are lisinopril . Will continue same regimen along with efforts to limit dietary sodium.       Relevant Orders   CBC with Differential/Platelet   Comprehensive metabolic panel with GFR   TSH   Urinalysis, Routine w reflex microscopic   Mixed hyperlipidemia (Chronic)   LDL is  Lab Results  Component Value Date   LDLCALC 113 (H) 02/01/2023   Current regimen is rosuvastatin  10 mg.  No medication side effects noted. Goal LDL is <70.       Relevant Orders   Lipid panel   History of seizure   After CEA in 2023 On Keppra  for one year then weaned off 07/2023      Acquired thrombophilia (HCC)   On Plavix   after stroke. No bleeding issues noted. Followed by Neurology.      Relevant Orders   CBC with Differential/Platelet   Other Visit Diagnoses       Annual physical exam    -  Primary   up to date on immunizations and screenings continue healthy diet and regular exercise.     Encounter for screening mammogram for breast cancer       Relevant Orders   MM 3D SCREENING MAMMOGRAM BILATERAL BREAST       Return in about 6 months (around 08/21/2024) for HTN.    Sheron Dixons, MD [facility] [place]

## 2024-02-19 NOTE — Assessment & Plan Note (Signed)
 Blood pressure is well controlled.  Current medications are lisinopril. Will continue same regimen along with efforts to limit dietary sodium.

## 2024-02-19 NOTE — Assessment & Plan Note (Signed)
 After CEA in 2023 On Keppra  for one year then weaned off 07/2023

## 2024-02-19 NOTE — Patient Instructions (Signed)
 Call [facility] to schedule your mammogram at [phone].

## 2024-02-20 ENCOUNTER — Ambulatory Visit: Payer: Self-pay | Admitting: Internal Medicine

## 2024-02-20 LAB — MICROSCOPIC EXAMINATION
Bacteria, UA: NONE SEEN
Casts: NONE SEEN /LPF
Epithelial Cells (non renal): NONE SEEN /HPF (ref 0–10)

## 2024-02-20 LAB — COMPREHENSIVE METABOLIC PANEL WITH GFR
ALT: 19 IU/L (ref 0–32)
AST: 20 IU/L (ref 0–40)
Albumin: 4.8 g/dL (ref 3.9–4.9)
Alkaline Phosphatase: 66 IU/L (ref 44–121)
BUN/Creatinine Ratio: 18 (ref 12–28)
BUN: 12 mg/dL (ref 8–27)
Bilirubin Total: 0.3 mg/dL (ref 0.0–1.2)
CO2: 22 mmol/L (ref 20–29)
Calcium: 10.7 mg/dL — ABNORMAL HIGH (ref 8.7–10.3)
Chloride: 100 mmol/L (ref 96–106)
Creatinine, Ser: 0.65 mg/dL (ref 0.57–1.00)
Globulin, Total: 2 g/dL (ref 1.5–4.5)
Glucose: 85 mg/dL (ref 70–99)
Potassium: 4.6 mmol/L (ref 3.5–5.2)
Sodium: 139 mmol/L (ref 134–144)
Total Protein: 6.8 g/dL (ref 6.0–8.5)
eGFR: 95 mL/min/{1.73_m2} (ref >59)

## 2024-02-20 LAB — URINALYSIS, ROUTINE W REFLEX MICROSCOPIC
Bilirubin, UA: NEGATIVE
Glucose, UA: NEGATIVE
Ketones, UA: NEGATIVE
Nitrite, UA: NEGATIVE
Protein,UA: NEGATIVE
RBC, UA: NEGATIVE
Specific Gravity, UA: 1.01 (ref 1.005–1.030)
Urobilinogen, Ur: 0.2 mg/dL (ref 0.2–1.0)
pH, UA: 6 (ref 5.0–7.5)

## 2024-02-20 LAB — CBC WITH DIFFERENTIAL/PLATELET
Basophils Absolute: 0.1 10*3/uL (ref 0.0–0.2)
Basos: 1 %
EOS (ABSOLUTE): 0.2 10*3/uL (ref 0.0–0.4)
Eos: 3 %
Hematocrit: 43 % (ref 34.0–46.6)
Hemoglobin: 14.1 g/dL (ref 11.1–15.9)
Immature Grans (Abs): 0 10*3/uL (ref 0.0–0.1)
Immature Granulocytes: 0 %
Lymphocytes Absolute: 1.9 10*3/uL (ref 0.7–3.1)
Lymphs: 31 %
MCH: 27.8 pg (ref 26.6–33.0)
MCHC: 32.8 g/dL (ref 31.5–35.7)
MCV: 85 fL (ref 79–97)
Monocytes Absolute: 0.5 10*3/uL (ref 0.1–0.9)
Monocytes: 8 %
Neutrophils Absolute: 3.5 10*3/uL (ref 1.4–7.0)
Neutrophils: 57 %
Platelets: 277 10*3/uL (ref 150–450)
RBC: 5.08 x10E6/uL (ref 3.77–5.28)
RDW: 13.5 % (ref 11.7–15.4)
WBC: 6.1 10*3/uL (ref 3.4–10.8)

## 2024-02-20 LAB — LIPID PANEL
Chol/HDL Ratio: 5.2 ratio — ABNORMAL HIGH (ref 0.0–4.4)
Cholesterol, Total: 243 mg/dL — ABNORMAL HIGH (ref 100–199)
HDL: 47 mg/dL (ref >39)
LDL Chol Calc (NIH): 165 mg/dL — ABNORMAL HIGH (ref 0–99)
Triglycerides: 170 mg/dL — ABNORMAL HIGH (ref 0–149)
VLDL Cholesterol Cal: 31 mg/dL (ref 5–40)

## 2024-02-20 LAB — TSH: TSH: 1.62 u[IU]/mL (ref 0.450–4.500)

## 2024-02-27 ENCOUNTER — Encounter (INDEPENDENT_AMBULATORY_CARE_PROVIDER_SITE_OTHER): Payer: Self-pay

## 2024-03-07 ENCOUNTER — Ambulatory Visit

## 2024-03-07 VITALS — Ht 62.0 in | Wt 175.0 lb

## 2024-03-07 DIAGNOSIS — Z0000 Encounter for general adult medical examination without abnormal findings: Secondary | ICD-10-CM

## 2024-03-07 NOTE — Progress Notes (Signed)
 Subjective:   Heather Dean is a 68 y.o. who presents for a Medicare Wellness preventive visit.  As a reminder, Annual Wellness Visits don't include a physical exam, and some assessments may be limited, especially if this visit is performed virtually. We may recommend an in-person follow-up visit with your provider if needed.  Visit Complete: Virtual I connected with  Andrena Ke on 03/07/24 by a audio enabled telemedicine application and verified that I am speaking with the correct person using two identifiers.  Patient Location: Home  Provider Location: Home Office  I discussed the limitations of evaluation and management by telemedicine. The patient expressed understanding and agreed to proceed.  Vital Signs: Because this visit was a virtual/telehealth visit, some criteria may be missing or patient reported. Any vitals not documented were not able to be obtained and vitals that have been documented are patient reported.  VideoDeclined- This patient declined Librarian, academic. Therefore the visit was completed with audio only.  Persons Participating in Visit: Patient.  AWV Questionnaire: Yes: Patient Medicare AWV questionnaire was completed by the patient on 03/03/24; I have confirmed that all information answered by patient is correct and no changes since this date.  Cardiac Risk Factors include: advanced age (>8men, >53 women);hypertension;dyslipidemia;obesity (BMI >30kg/m2);Other (see comment), Risk factor comments: OSA (no cpap)     Objective:     Today's Vitals   03/07/24 1600  Weight: 175 lb (79.4 kg)  Height: 5\' 2"  (1.575 m)   Body mass index is 32.01 kg/m.     03/07/2024    4:13 PM 03/27/2023    8:24 AM 01/19/2023   11:30 AM 05/18/2022    7:14 AM 01/05/2022   10:51 AM  Advanced Directives  Does Patient Have a Medical Advance Directive? No No No No Yes  Type of Agricultural consultant;Living will  Copy of  Healthcare Power of Attorney in Chart?     No - copy requested  Would patient like information on creating a medical advance directive? Yes (MAU/Ambulatory/Procedural Areas - Information given) No - Patient declined No - Patient declined Yes (ED - Information included in AVS)     Current Medications (verified) Outpatient Encounter Medications as of 03/07/2024  Medication Sig   aspirin  81 MG chewable tablet Chew 81 mg by mouth daily.   Bioflavonoid Products (BIOFLEX) TABS Take 1 each by mouth.   clopidogrel  (PLAVIX ) 75 MG tablet Take 1 tablet (75 mg total) by mouth daily.   fluticasone  (FLONASE ) 50 MCG/ACT nasal spray SPRAY 2 SPRAYS INTO EACH NOSTRIL EVERY DAY   lisinopril  (ZESTRIL ) 20 MG tablet TAKE 1 TABLET BY MOUTH EVERY DAY   montelukast  (SINGULAIR ) 10 MG tablet TAKE 1 TABLET BY MOUTH EVERYDAY AT BEDTIME   Multiple Vitamin (MULTIVITAMIN ADULT PO) Take by mouth. Centrum Silver   rosuvastatin  (CRESTOR ) 10 MG tablet TAKE 1 TABLET BY MOUTH EVERY DAY   UNABLE TO FIND Take 5,000 mcg by mouth daily. Med Name: Nature's Bounty - HAIR, SKIN & NAILS supplement - patient reports taking 3 Softgels a day   No facility-administered encounter medications on file as of 03/07/2024.    Allergies (verified) Patient has no known allergies.   History: Past Medical History:  Diagnosis Date   Allergy    Arthritis    Basal cell carcinoma    Coronary artery disease    Hyperlipidemia    Hypertension    Obesity    Seizures (HCC)  Past Surgical History:  Procedure Laterality Date   BREAST CYST ASPIRATION Left    BREAST EXCISIONAL BIOPSY Right    CAROTID PTA/STENT INTERVENTION Left 05/18/2022   Procedure: CAROTID PTA/STENT INTERVENTION;  Surgeon: Celso College, MD;  Location: [facility] INVASIVE CV LAB;  Service: Cardiovascular;  Laterality: Left;   COLONOSCOPY     COLONOSCOPY WITH PROPOFOL  N/A 04/01/2016   Procedure: COLONOSCOPY WITH PROPOFOL ;  Surgeon: Cassie Click, MD;  Location: [facility] ENDOSCOPY;   Service: Endoscopy;  Laterality: N/A;   COLONOSCOPY WITH PROPOFOL  N/A 03/27/2023   Procedure: COLONOSCOPY WITH PROPOFOL ;  Surgeon: Marnee Sink, MD;  Location: [facility];  Service: Endoscopy;  Laterality: N/A;   TONSILLECTOMY     TUBAL LIGATION     Family History  Problem Relation Age of Onset   Hypertension Mother    COPD Mother    Depression Mother    Vision loss Mother    Varicose Veins Mother    Heart disease Father    Cancer Father    Hypertension Brother    Melanoma Maternal Aunt    Cancer Maternal Aunt    Breast cancer Paternal Aunt    Cancer Paternal Aunt    Intellectual disability Paternal Uncle    Learning disabilities Paternal Uncle    Hypertension Maternal Grandmother    Hypertension Maternal Grandfather    Cancer Maternal Grandfather    Social History   Socioeconomic History   Marital status: Divorced    Spouse name: Not on file   Number of children: 0   Years of education: Not on file   Highest education level: Some college, no degree  Occupational History   Occupation: retired  Tobacco Use   Smoking status: Never    Passive exposure: Past   Smokeless tobacco: Never  Vaping Use   Vaping status: Never Used  Substance and Sexual Activity   Alcohol use: Not Currently   Drug use: No   Sexual activity: Not Currently    Birth control/protection: None  Other Topics Concern   Not on file  Social History Narrative   Pt lives alone with her pets: 3 cats, 2 dogs,    Social Drivers of Corporate investment banker Strain: Low Risk  (03/07/2024)   Overall Financial Resource Strain (CARDIA)    Difficulty of Paying Living Expenses: Not hard at all  Food Insecurity: No Food Insecurity (03/07/2024)   Hunger Vital Sign    Worried About Running Out of Food in the Last Year: Never true    Ran Out of Food in the Last Year: Never true  Transportation Needs: No Transportation Needs (03/07/2024)   PRAPARE - Administrator, Civil Service (Medical):  No    Lack of Transportation (Non-Medical): No  Physical Activity: Insufficiently Active (03/07/2024)   Exercise Vital Sign    Days of Exercise per Week: 3 days    Minutes of Exercise per Session: 20 min  Stress: No Stress Concern Present (03/07/2024)   [employer] of Occupational Health - Occupational Stress Questionnaire    Feeling of Stress : Not at all  Social Connections: Moderately Integrated (03/07/2024)   Social Connection and Isolation Panel [NHANES]    Frequency of Communication with Friends and Family: More than three times a week    Frequency of Social Gatherings with Friends and Family: More than three times a week    Attends Religious Services: More than 4 times per year    Active Member of [employer] or Organizations:  Yes    Attends Club or Organization Meetings: More than 4 times per year    Marital Status: Divorced    Tobacco Counseling Counseling given: No    Clinical Intake:  Pre-visit preparation completed: Yes  Pain : No/denies pain     BMI - recorded: 32.01 Nutritional Status: BMI > 30  Obese Nutritional Risks: None Diabetes: No  Lab Results  Component Value Date   HGBA1C 5.5 01/28/2022     How often do you need to have someone help you when you read instructions, pamphlets, or other written materials from your doctor or pharmacy?: 1 - Never  Interpreter Needed?: No  Information entered by :: Jaunita Messier, CMA   Activities of Daily Living     03/07/2024    4:02 PM 03/03/2024    9:18 PM  In your present state of health, do you have any difficulty performing the following activities:  Hearing? 0 0  Vision? 0 0  Difficulty concentrating or making decisions? 0 0  Walking or climbing stairs? 0 0  Dressing or bathing? 0 0  Doing errands, shopping? 0 0  Preparing Food and eating ? N   Using the Toilet? N N  In the past six months, have you accidently leaked urine? Y Y  Comment wears panty liner prn   Do you have problems with loss of bowel  control? N N  Managing your Medications? N N  Managing your Finances? N N  Housekeeping or managing your Housekeeping? N N    Patient Care Team: Sheron Dixons, MD as PCP - General (Internal Medicine) Mariane Shire, MD as Referring Physician (Dermatology) Celso College, MD as Referring Physician (Vascular Surgery) Rosan Comfort, MD as Consulting Physician (Neurology) Marnee Sink, MD as Consulting Physician (Gastroenterology)  Indicate any recent Medical Services you may have received from other than [facility] providers in the past year (date may be approximate).     Assessment:    This is a routine wellness examination for [place].  Hearing/Vision screen Hearing Screening - Comments:: Denies hearing loss Vision Screening - Comments:: Gets routine eye exams, Dr. Charon Copper, [facility] [place]   Goals Addressed               This Visit's Progress     Weight (lb) < 150 lb (68 kg) (pt-stated)   175 lb (79.4 kg)      Depression Screen     03/07/2024    4:11 PM 02/19/2024    2:24 PM 07/18/2023    9:27 AM 02/13/2023    8:56 AM 02/01/2023    8:26 AM 01/19/2023   11:29 AM 07/07/2022   11:03 AM  PHQ 2/9 Scores  PHQ - 2 Score 0 0 0 0 0 0 1  PHQ- 9 Score 0 0 2 2 7  0 5    Fall Risk     03/07/2024    4:16 PM 03/03/2024    9:18 PM 02/19/2024    2:24 PM 07/18/2023    9:27 AM 02/13/2023    8:56 AM  Fall Risk   Falls in the past year? 0 0 0 1 0  Number falls in past yr: 0 0 0 0 0  Injury with Fall? 0 0 0 0 0  Risk for fall due to : No Fall Risks  No Fall Risks History of fall(s) No Fall Risks  Follow up Falls evaluation completed  Falls evaluation completed Falls evaluation completed Falls evaluation completed    MEDICARE RISK AT  HOME:  Medicare Risk at Home Any stairs in or around the home?: Yes If so, are there any without handrails?: No Home free of loose throw rugs in walkways, pet beds, electrical cords, etc?: Yes Adequate lighting in your home to reduce risk of falls?:  Yes Life alert?: No Use of a cane, walker or w/c?: No Grab bars in the bathroom?: Yes Shower chair or bench in shower?: No Elevated toilet seat or a handicapped toilet?: Yes  TIMED UP AND GO:  Was the test performed?  No  Cognitive Function: 6CIT completed        03/07/2024    4:16 PM 01/19/2023   11:36 AM  6CIT Screen  What Year? 0 points 0 points  What month? 0 points 0 points  What time? 0 points 0 points  Count back from 20 0 points 0 points  Months in reverse 0 points 0 points  Repeat phrase 0 points 0 points  Total Score 0 points 0 points    Immunizations Immunization History  Administered Date(s) Administered   Fluad Quad(high Dose 65+) 08/18/2021, 07/07/2022   Fluad Trivalent(High Dose 65+) 07/18/2023   PFIZER(Purple Top)SARS-COV-2 Vaccination 11/30/2019, 01/02/2020   PNEUMOCOCCAL CONJUGATE-20 01/28/2022   Tdap 08/19/2010, 02/01/2023   Zoster Recombinant(Shingrix ) 01/28/2022, 04/14/2022    Screening Tests Health Maintenance  Topic Date Due   COVID-19 Vaccine (3 - Pfizer risk series) 01/30/2020   MAMMOGRAM  12/24/2022   INFLUENZA VACCINE  05/10/2024   Medicare Annual Wellness (AWV)  03/07/2025   DEXA SCAN  04/12/2027   Colonoscopy  03/26/2030   DTaP/Tdap/Td (3 - Td or Tdap) 01/31/2033   Pneumonia Vaccine 64+ Years old  Completed   Hepatitis C Screening  Completed   Zoster Vaccines- Shingrix   Completed   HPV VACCINES  Aged Out   Meningococcal B Vaccine  Aged Out    Health Maintenance  Health Maintenance Due  Topic Date Due   COVID-19 Vaccine (3 - Pfizer risk series) 01/30/2020   MAMMOGRAM  12/24/2022   Health Maintenance Items Addressed: See Nurse Notes  Additional Screening:  Vision Screening: Recommended annual ophthalmology exams for early detection of glaucoma and other disorders of the eye.  Dental Screening: Recommended annual dental exams for proper oral hygiene  Community Resource Referral / Chronic Care Management: CRR required  this visit?  No   CCM required this visit?  No   Plan:    I have personally reviewed and noted the following in the patient's chart:   Medical and social history Use of alcohol, tobacco or illicit drugs  Current medications and supplements including opioid prescriptions. Patient is not currently taking opioid prescriptions. Functional ability and status Nutritional status Physical activity Advanced directives List of other physicians Hospitalizations, surgeries, and ER visits in previous 12 months Vitals Screenings to include cognitive, depression, and falls Referrals and appointments  In addition, I have reviewed and discussed with patient certain preventive protocols, quality metrics, and best practice recommendations. A written personalized care plan for preventive services as well as general preventive health recommendations were provided to patient.   Jaunita Messier, CMA   03/07/2024   After Visit Summary: (MyChart) Due to this being a telephonic visit, the after visit summary with patients personalized plan was offered to patient via MyChart   Notes: Please refer to Routing Comments.

## 2024-03-07 NOTE — Patient Instructions (Signed)
 [name] , Thank you for taking time out of your busy schedule to complete your Annual Wellness Visit with me. I enjoyed our conversation and look forward to speaking with you again next year. I, as well as your care team,  appreciate your ongoing commitment to your health goals. Please review the following plan we discussed and let me know if I can assist you in the future. Your Game plan/ To Do List    Referrals: None  Follow up Visits: Next Medicare AWV with our clinical staff: 03/20/25 @ 3:50pm (phone visit)   Have you seen your provider in the last 6 months (3 months if uncontrolled diabetes)? Yes Next Office Visit with your provider: 08/21/24 @ 8:20am with Dr. Gala Jubilee  Clinician Recommendations: Call [phone] to schedule a mammogram at your convenience.  Aim for 30 minutes of exercise or brisk walking, 6-8 glasses of water , and 5 servings of fruits and vegetables each day.       This is a list of the screening recommended for you and due dates:  Health Maintenance  Topic Date Due   COVID-19 Vaccine (3 - Pfizer risk series) 01/30/2020   Mammogram  12/24/2022   Flu Shot  05/10/2024   Medicare Annual Wellness Visit  03/07/2025   DEXA scan (bone density measurement)  04/12/2027   Colon Cancer Screening  03/26/2030   DTaP/Tdap/Td vaccine (3 - Td or Tdap) 01/31/2033   Pneumonia Vaccine  Completed   Hepatitis C Screening  Completed   Zoster (Shingles) Vaccine  Completed   HPV Vaccine  Aged Out   Meningitis B Vaccine  Aged Out    Advanced directives: (ACP Link)Information on Advanced Care Planning can be found at [place]  Secretary of [facility] Advance Health Care Directives Advance Health Care Directives. [email or web address] You may also get these forms at your doctor's office.  Advance Care Planning is important because it:  [x]  Makes sure you receive the medical care that is consistent with your values, goals, and preferences  [x]  It provides guidance to your family and loved ones  and reduces their decisional burden about whether or not they are making the right decisions based on your wishes.  Follow the link provided in your after visit summary or read over the paperwork we have mailed to you to help you started getting your Advance Directives in place. If you need assistance in completing these, please reach out to us  so that we can help you!  See attachments for Preventive Care and Fall Prevention Tips.   Fall Prevention in the Home, Adult Falls can cause injuries and affect people of all ages. There are many simple things that you can do to make your home safe and to help prevent falls. If you need it, ask for help making these changes. What actions can I take to prevent falls? General information Use good lighting in all rooms. Make sure to: Replace any light bulbs that burn out. Turn on lights if it is dark and use night-lights. Keep items that you use often in easy-to-reach places. Lower the shelves around your home if needed. Move furniture so that there are clear paths around it. Do not keep throw rugs or other things on the floor that can make you trip. If any of your floors are uneven, fix them. Add color or contrast paint or tape to clearly mark and help you see: Grab bars or handrails. First and last steps of staircases. Where the edge of each step is. If you  use a ladder or stepladder: Make sure that it is fully opened. Do not climb a closed ladder. Make sure the sides of the ladder are locked in place. Have someone hold the ladder while you use it. Know where your pets are as you move through your home. What can I do in the bathroom?     Keep the floor dry. Clean up any water  that is on the floor right away. Remove soap buildup in the bathtub or shower. Buildup makes bathtubs and showers slippery. Use non-skid mats or decals on the floor of the bathtub or shower. Attach bath mats securely with double-sided, non-slip rug tape. If you need to  sit down while you are in the shower, use a non-slip stool. Install grab bars by the toilet and in the bathtub and shower. Do not use towel bars as grab bars. What can I do in the bedroom? Make sure that you have a light by your bed that is easy to reach. Do not use any sheets or blankets on your bed that hang to the floor. Have a firm bench or chair with side arms that you can use for support when you get dressed. What can I do in the kitchen? Clean up any spills right away. If you need to reach something above you, use a sturdy step stool that has a grab bar. Keep electrical cables out of the way. Do not use floor polish or wax that makes floors slippery. What can I do with my stairs? Do not leave anything on the stairs. Make sure that you have a light switch at the top and the bottom of the stairs. Have them installed if you do not have them. Make sure that there are handrails on both sides of the stairs. Fix handrails that are broken or loose. Make sure that handrails are as long as the staircases. Install non-slip stair treads on all stairs in your home if they do not have carpet. Avoid having throw rugs at the top or bottom of stairs, or secure the rugs with carpet tape to prevent them from moving. Choose a carpet design that does not hide the edge of steps on the stairs. Make sure that carpet is firmly attached to the stairs. Fix any carpet that is loose or worn. What can I do on the outside of my home? Use bright outdoor lighting. Repair the edges of walkways and driveways and fix any cracks. Clear paths of anything that can make you trip, such as tools or rocks. Add color or contrast paint or tape to clearly mark and help you see high doorway thresholds. Trim any bushes or trees on the main path into your home. Check that handrails are securely fastened and in good repair. Both sides of all steps should have handrails. Install guardrails along the edges of any raised decks or  porches. Have leaves, snow, and ice cleared regularly. Use sand, salt, or ice melt on walkways during winter months if you live where there is ice and snow. In the garage, clean up any spills right away, including grease or oil spills. What other actions can I take? Review your medicines with your health care provider. Some medicines can make you confused or feel dizzy. This can increase your chance of falling. Wear closed-toe shoes that fit well and support your feet. Wear shoes that have rubber soles and low heels. Use a cane, walker, scooter, or crutches that help you move around if needed. Talk with your provider  about other ways that you can decrease your risk of falls. This may include seeing a physical therapist to learn to do exercises to improve movement and strength. Where to find more information Centers for Disease Control and Prevention, STEADI: TonerPromos.no [employer] on Aging: BaseRingTones.pl National Institute on Aging: BaseRingTones.pl Contact a health care provider if: You are afraid of falling at home. You feel weak, drowsy, or dizzy at home. You fall at home. Get help right away if you: Lose consciousness or have trouble moving after a fall. Have a fall that causes a head injury. These symptoms may be an emergency. Get help right away. Call 911. Do not wait to see if the symptoms will go away. Do not drive yourself to the hospital. This information is not intended to replace advice given to you by your health care provider. Make sure you discuss any questions you have with your health care provider. Document Revised: 05/30/2022 Document Reviewed: 05/30/2022 Elsevier Patient Education  2024 [employer].

## 2024-03-19 IMAGING — MG MM DIGITAL DIAGNOSTIC UNILAT*L* W/ TOMO W/ CAD
5 series · 6 of 9 positions shown · non-contrast
Comparison: Previous new baseline exam.

CLINICAL DATA: Callback for LEFT breast calcifications from new
baseline mammogram. History of cysts.

EXAM:
DIGITAL DIAGNOSTIC UNILATERAL LEFT MAMMOGRAM WITH TOMOSYNTHESIS AND
CAD
TECHNIQUE: Left digital diagnostic mammography and breast tomosynthesis was
performed. The images were evaluated with computer-aided detection.

[L CC (1 of 2)]
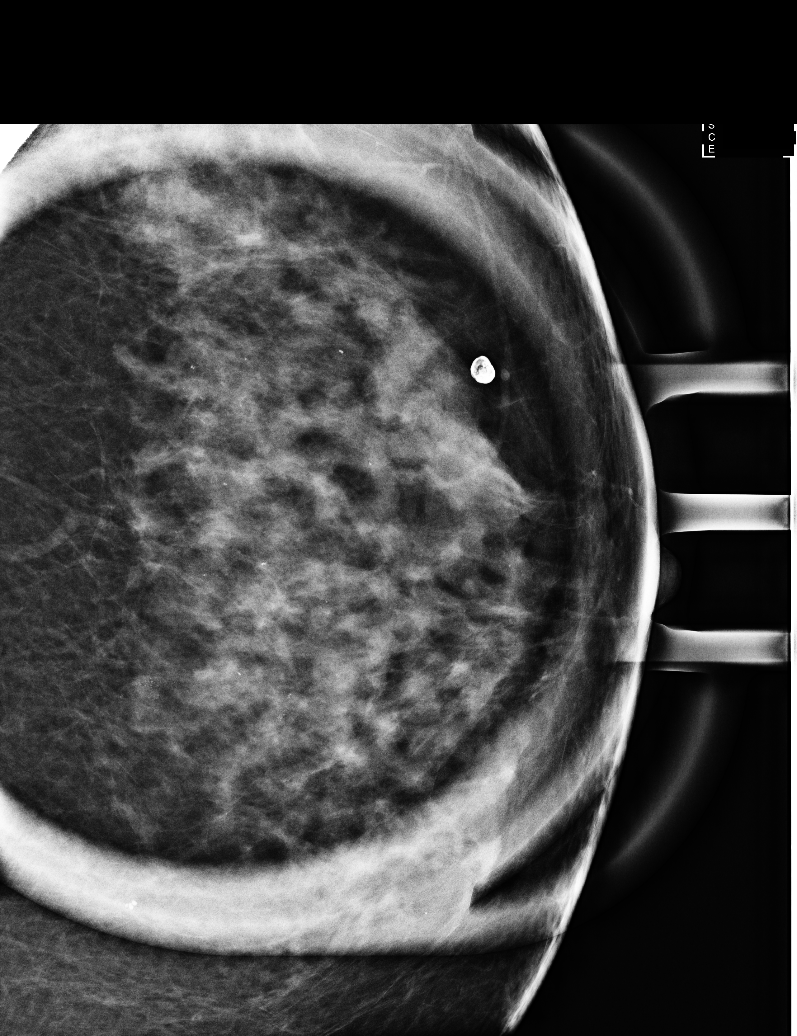

[L ML]
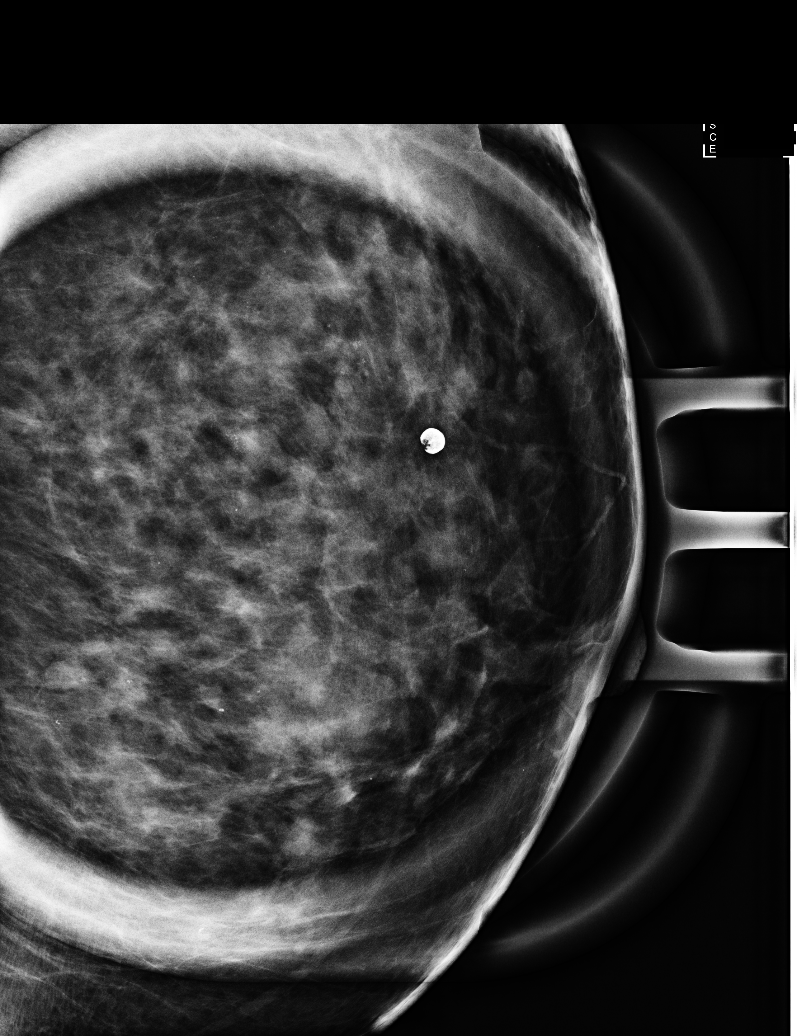

[L CC (2 of 2)]
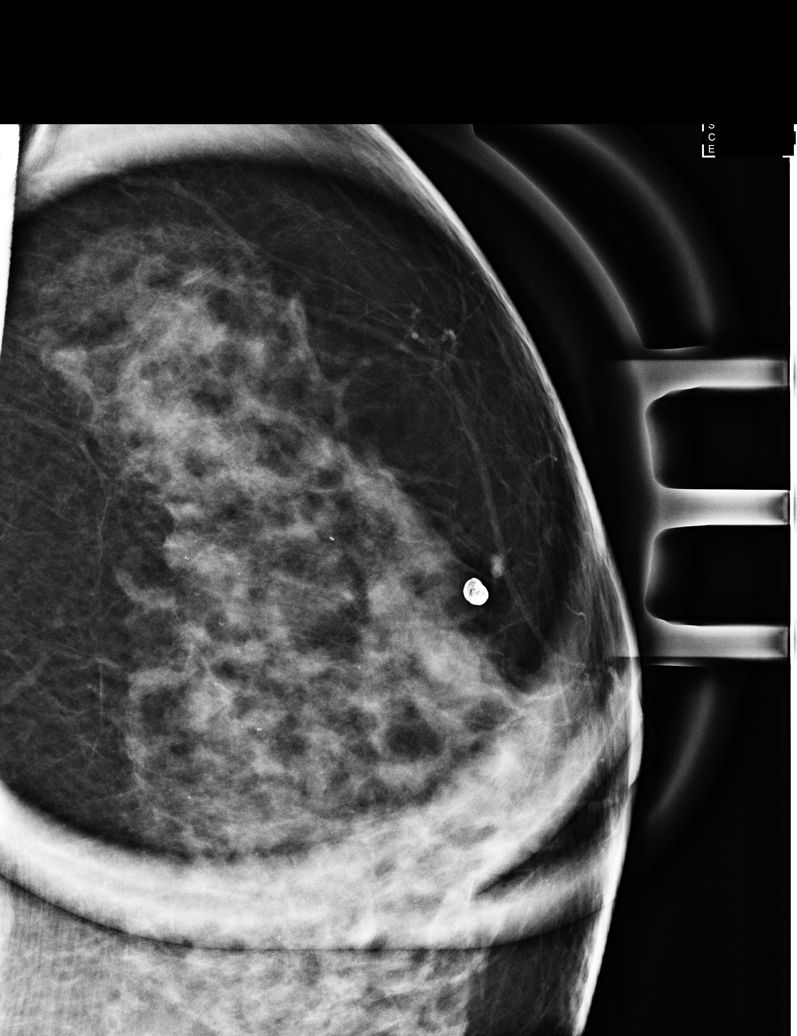

[L ML synth-2D]
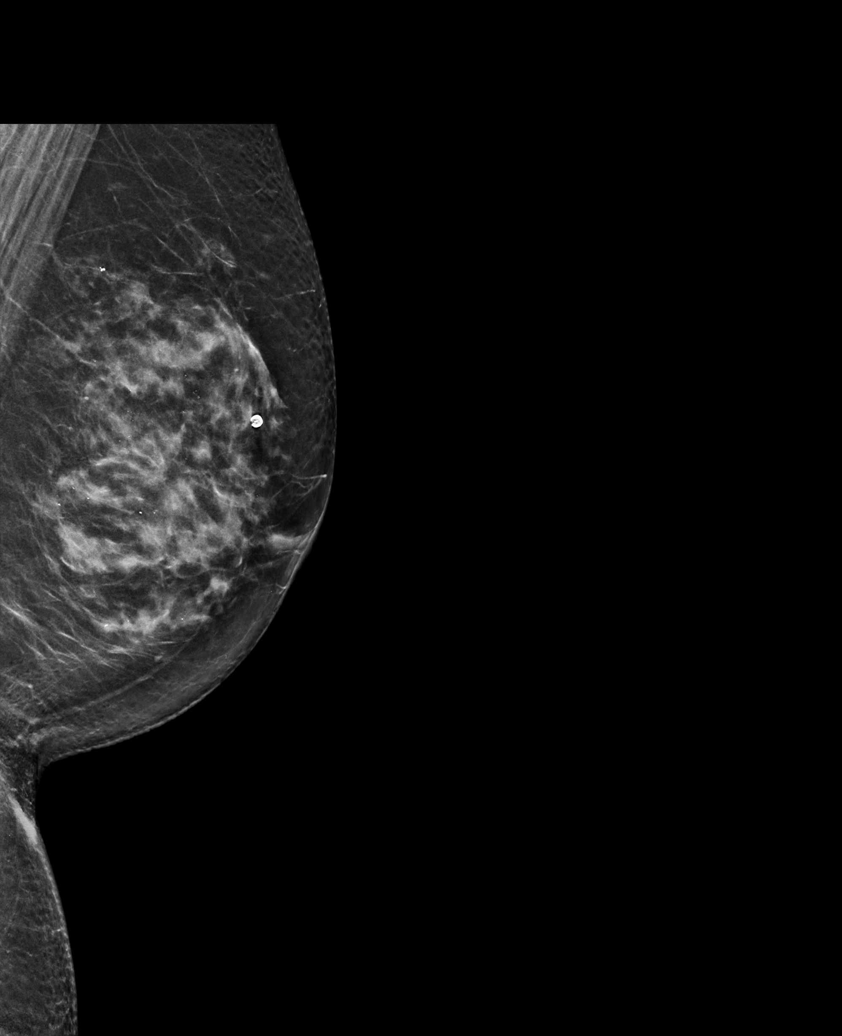

[L ML tomo · 2 of 68 frames shown]
[frame 22/68]
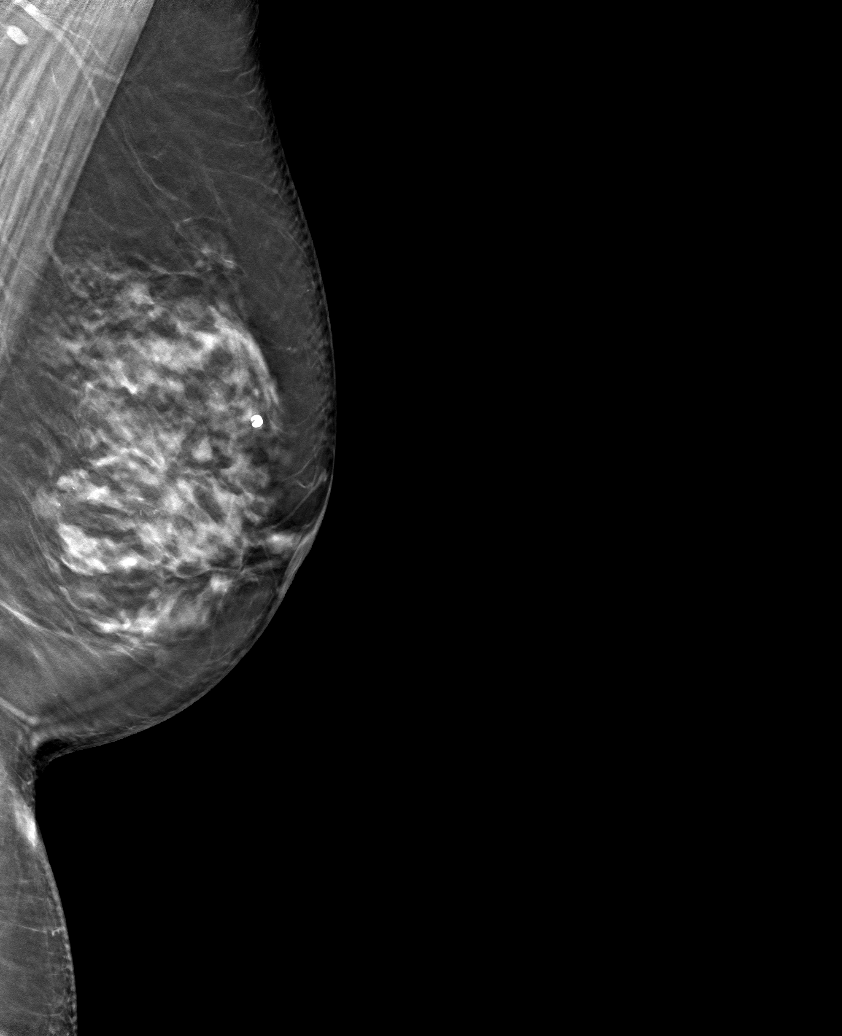
[frame 35/68]
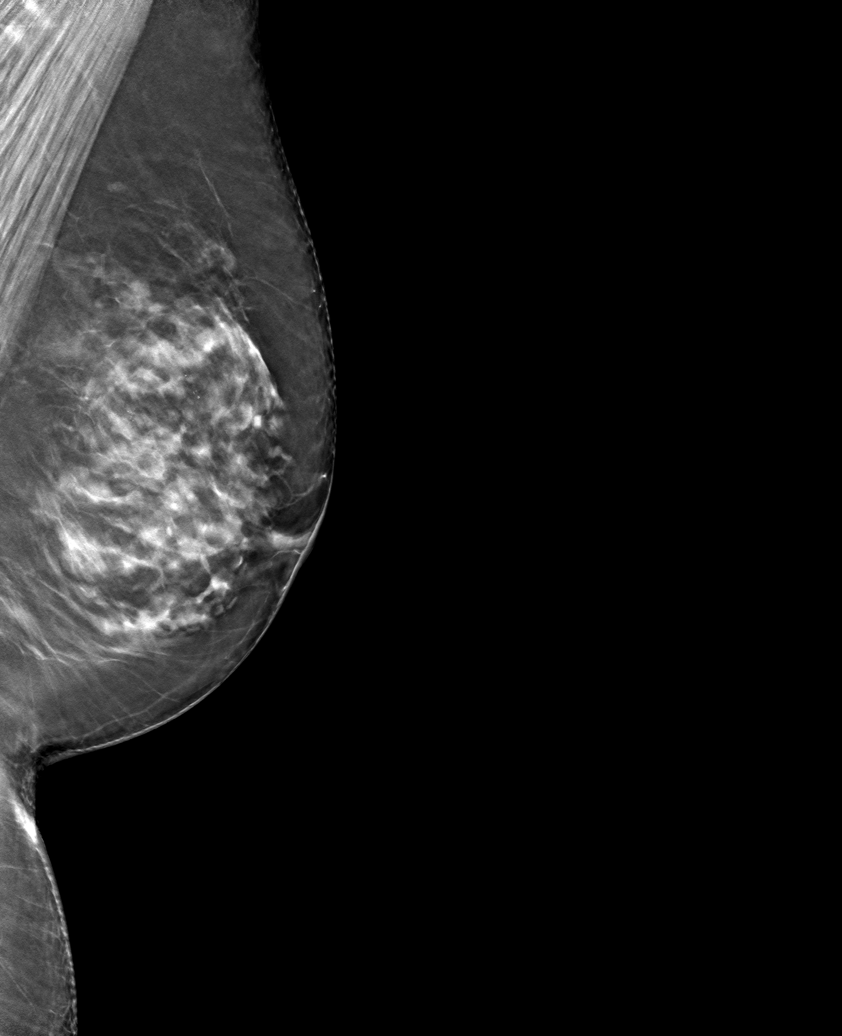

[6 of 9 positions shown; findings below may reference images not displayed]

ACR Breast Density Category c: The breast tissue is heterogeneously
dense, which may obscure small masses.
FINDINGS: Spot magnification views of the LEFT breast demonstrate scattered
and occasionally grouped punctate/round calcifications in the LEFT
breast. There is a focal group of calcifications in the LEFT upper
inner breast at middle depth; it measures approximately 4 mm. There
is an additional 4 mm group of calcifications noted in the LEFT
upper inner breast at anterior to middle depth. There is an
additional loosely grouped 6 mm area of calcifications in the upper
breast at middle depth, best seen on spot ML imaging.
IMPRESSION: There are several groups of punctate/round calcifications in the
predominately LEFT upper breast at anterior to middle depth.
Recommend diagnostic mammogram in 6 months. This will establish 6
months of definitive stability from new baseline mammogram.

RECOMMENDATION:
LEFT diagnostic mammogram in 6 months.

I have discussed the findings and recommendations with the patient.
If applicable, a reminder letter will be sent to the patient
regarding the next appointment.

BI-RADS CATEGORY  3: Probably benign.

## 2024-03-25 ENCOUNTER — Other Ambulatory Visit: Payer: Self-pay | Admitting: Internal Medicine

## 2024-03-25 DIAGNOSIS — R921 Mammographic calcification found on diagnostic imaging of breast: Secondary | ICD-10-CM

## 2024-03-29 ENCOUNTER — Ambulatory Visit
Admission: RE | Admit: 2024-03-29 | Discharge: 2024-03-29 | Disposition: A | Discharge status: Home / Self Care | Source: Ambulatory Visit | Attending: Internal Medicine | Admitting: Internal Medicine

## 2024-03-29 DIAGNOSIS — R921 Mammographic calcification found on diagnostic imaging of breast: Secondary | ICD-10-CM | POA: Insufficient documentation

## 2024-04-16 IMAGING — US US CAROTID DUPLEX BILAT
1 series · 13 of 24 positions shown · non-contrast
Comparison: None Available.

CLINICAL DATA: RIGHT asymptomatic carotid bruit. Hypertension,
hyperlipidemia.

EXAM:
BILATERAL CAROTID DUPLEX ULTRASOUND
TECHNIQUE: Gray scale imaging, color Doppler and duplex ultrasound were
performed of bilateral carotid and vertebral arteries in the neck.

[Series 1: us carotid bilateral · 74 acquisitions, 13 frames shown]
[im 1/74]
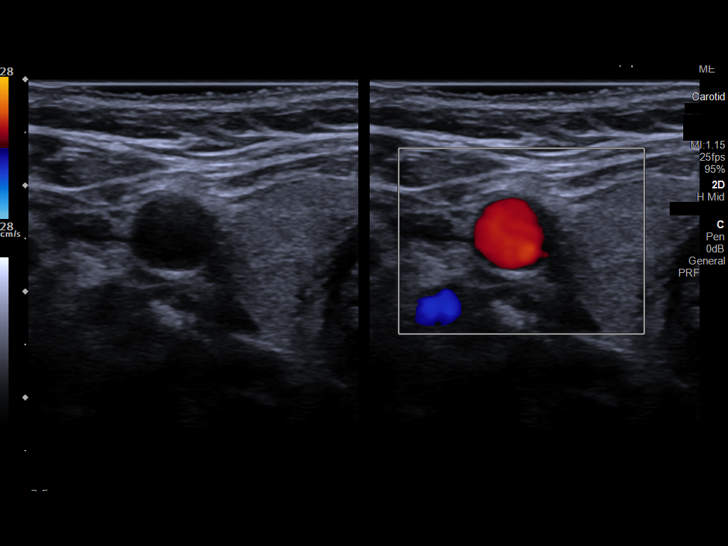
[im 7/74]
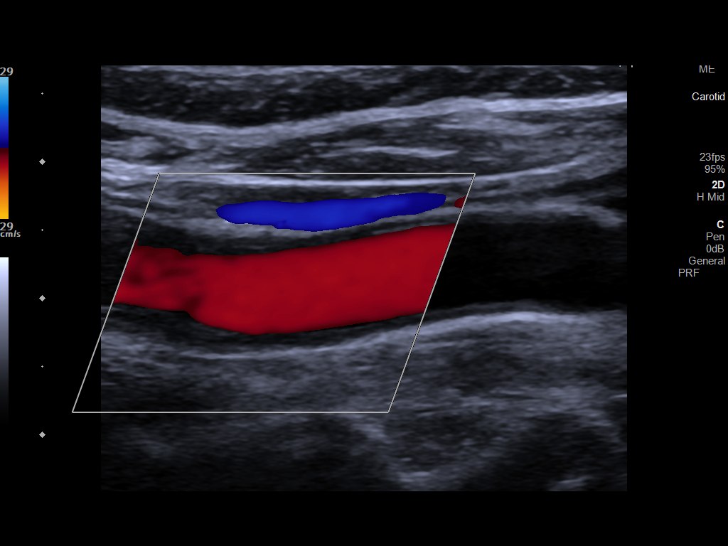
[im 13/74]
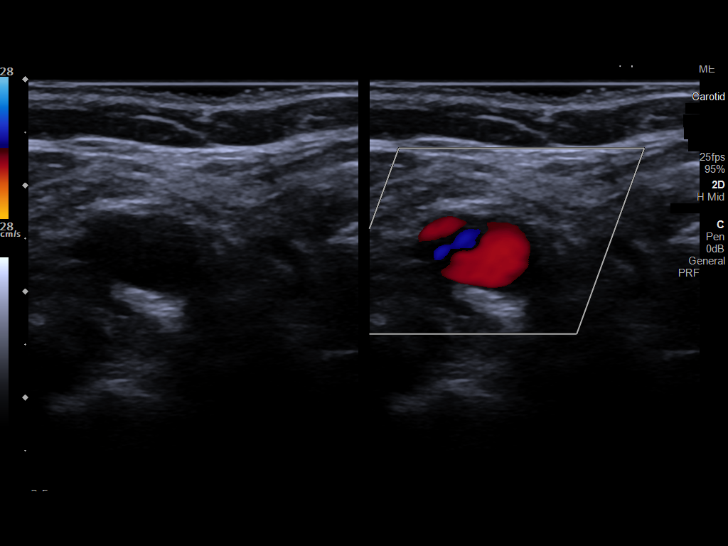
[im 20/74]
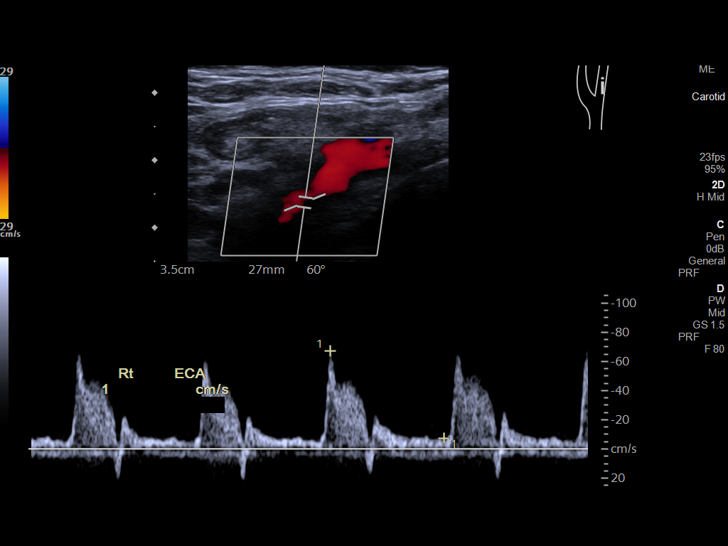
[im 26/74]
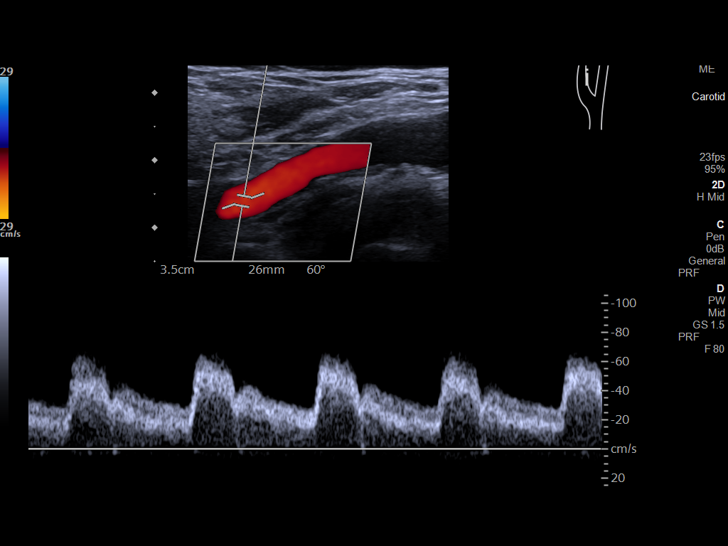
[im 32/74]
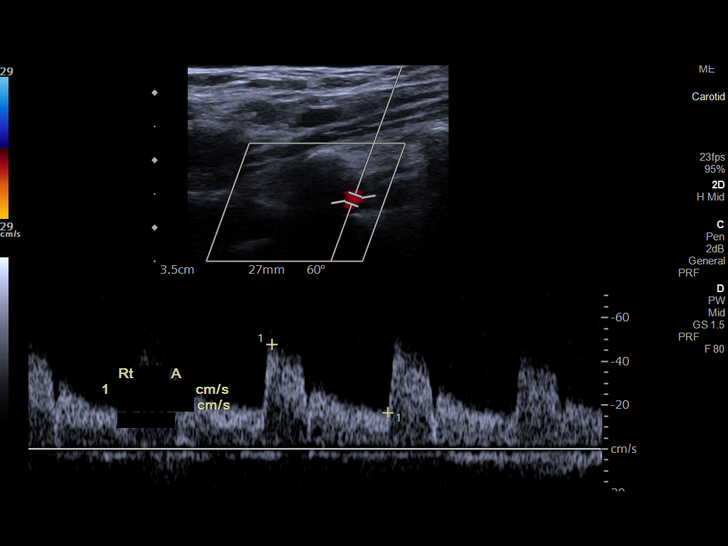
[im 42/74]
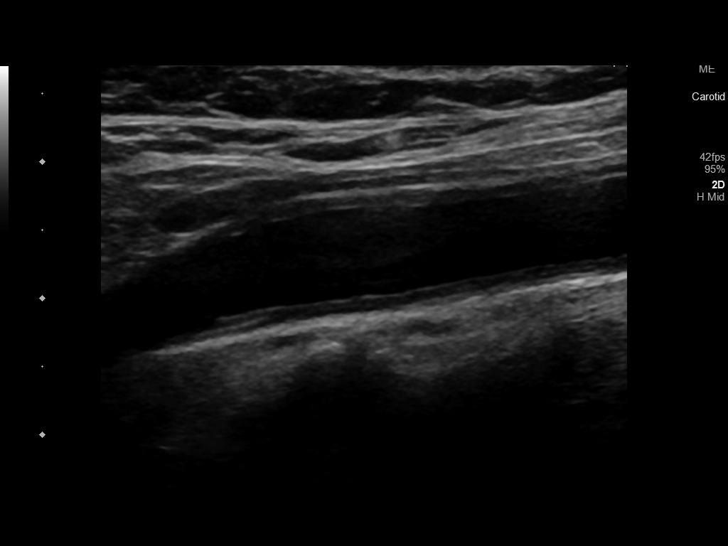
[im 45/74]
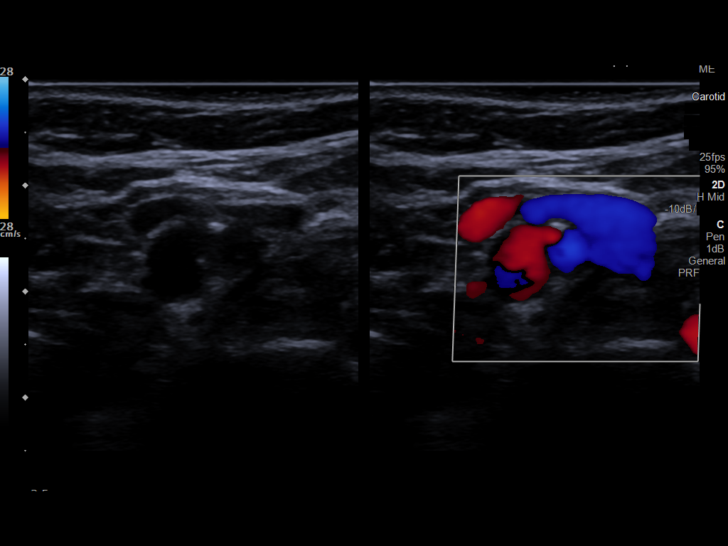
[im 51/74]
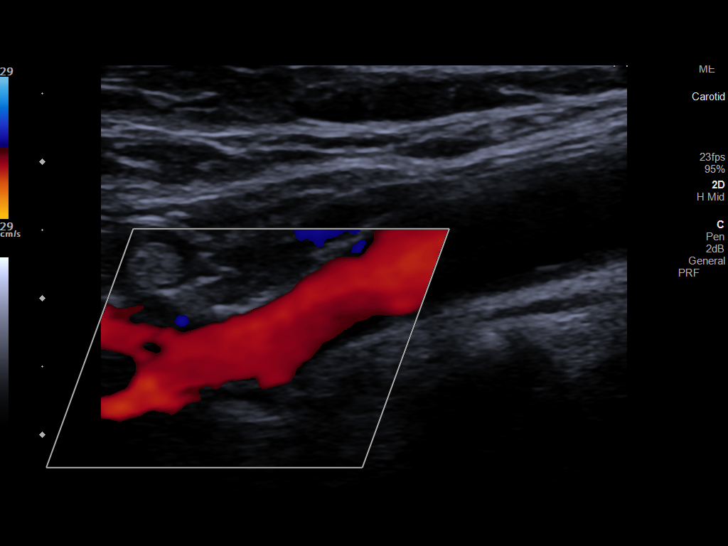
[im 58/74]
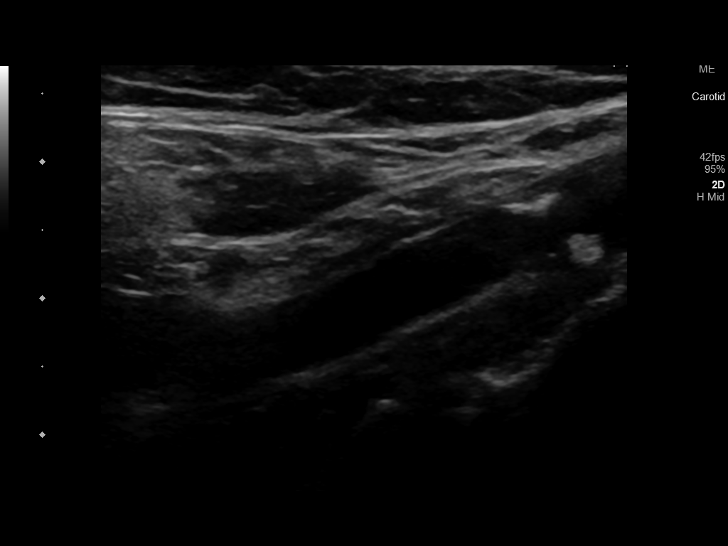
[im 64/74]
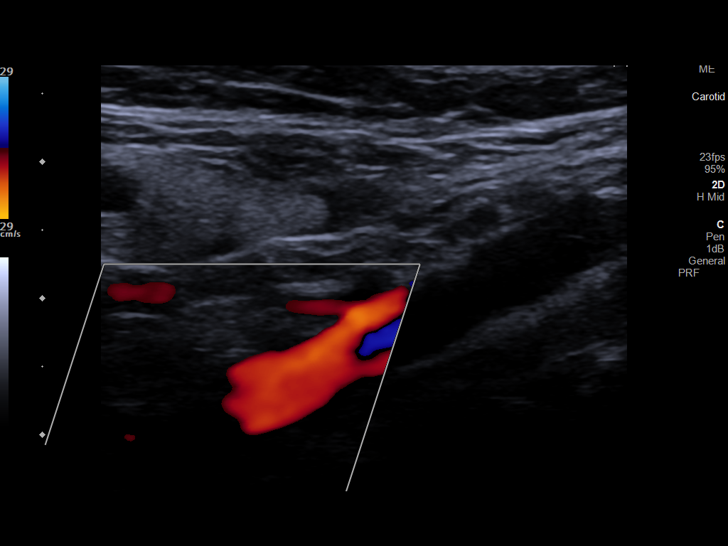
[im 70/74]
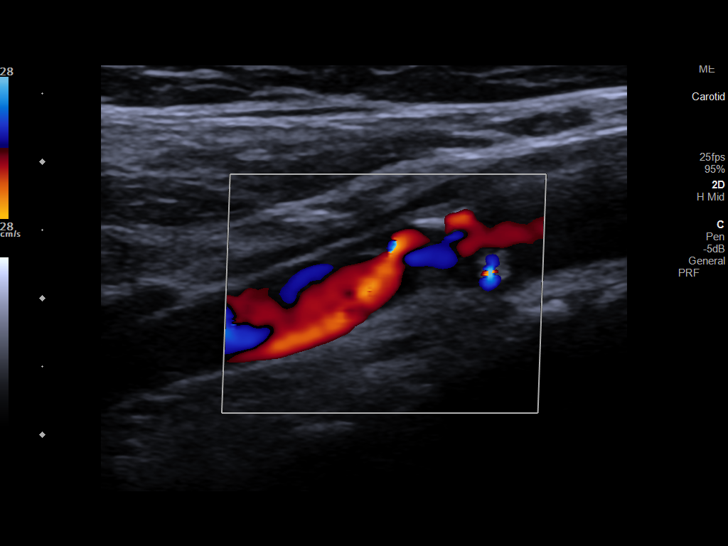
[im 74/74]
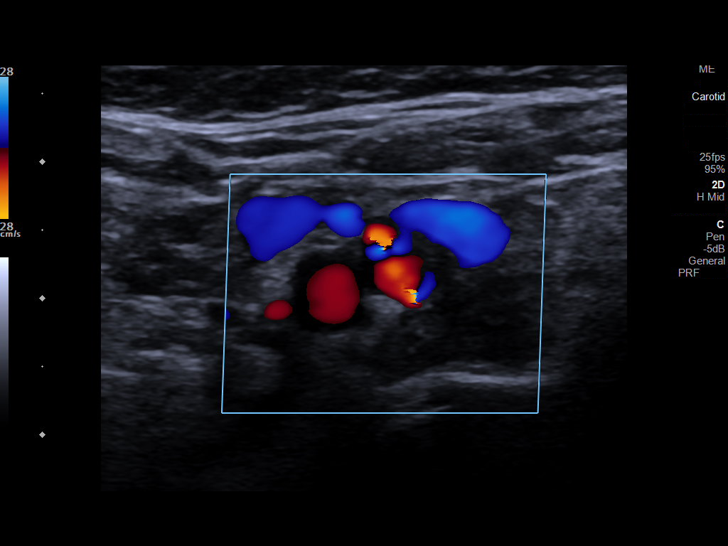

[13 of 24 positions shown; findings below may reference images not displayed]

FINDINGS: Criteria: Quantification of carotid stenosis is based on velocity
parameters that correlate the residual internal carotid diameter
with NASCET-based stenosis levels, using the diameter of the distal
internal carotid lumen as the denominator for stenosis measurement.

The following velocity measurements were obtained:

RIGHT

ICA: 74/34 cm/sec

CCA: 57/18 cm/sec

SYSTOLIC ICA/CCA RATIO:

ECA: 67 cm/sec

LEFT

ICA: 550/437 cm/sec

CCA: 66/15 cm/sec

SYSTOLIC ICA/CCA RATIO:

ECA: 72 cm/sec

RIGHT CAROTID ARTERY: Smooth plaque through the common carotid
without high-grade stenosis. Partially calcified plaque in the bulb
and proximal ICA resulting in only mild stenosis. Normal waveforms
and color Doppler signal throughout.

RIGHT VERTEBRAL ARTERY:  Normal flow direction and waveform.

LEFT CAROTID ARTERY: Mild intimal thickening through the common
carotid. Calcified eccentric plaque in the bulb and ICA origin.
There is high-grade stenosis in the proximal ICA, with markedly
elevated peak systolic velocities just distal to the stenosis. In
the more distal ICA, parvus tardus monophasic waveform. Elsewhere,
normal waveforms and color Doppler signal.

LEFT VERTEBRAL ARTERY:  Normal flow direction and waveform.
IMPRESSION: 1. Proximal LEFT ICA plaque with markedly elevated systolic and
diastolic velocities consistent with stenosis at the higher end of
the 70-99% range. Recommend vascular surgical consultation.
2. Mild RIGHT carotid plaque less than 50% diameter stenosis.
3.  Antegrade bilateral vertebral arterial flow.

## 2024-04-20 ENCOUNTER — Other Ambulatory Visit: Payer: Self-pay | Admitting: Internal Medicine

## 2024-04-20 DIAGNOSIS — I6522 Occlusion and stenosis of left carotid artery: Secondary | ICD-10-CM

## 2024-04-22 NOTE — Telephone Encounter (Signed)
 Requested Prescriptions  Pending Prescriptions Disp Refills   clopidogrel  (PLAVIX ) 75 MG tablet [Pharmacy Med Name: CLOPIDOGREL  75 MG TABLET] 90 tablet 1    Sig: TAKE 1 TABLET BY MOUTH EVERY DAY     Hematology: Antiplatelets - clopidogrel  Passed - 04/22/2024  2:53 PM      Passed - HCT in normal range and within 180 days    Hematocrit  Date Value Ref Range Status  02/19/2024 43.0 34.0 - 46.6 % Final         Passed - HGB in normal range and within 180 days    Hemoglobin  Date Value Ref Range Status  02/19/2024 14.1 11.1 - 15.9 g/dL Final         Passed - PLT in normal range and within 180 days    Platelets  Date Value Ref Range Status  02/19/2024 277 150 - 450 x10E3/uL Final         Passed - Cr in normal range and within 360 days    Creatinine, Ser  Date Value Ref Range Status  02/19/2024 0.65 0.57 - 1.00 mg/dL Final         Passed - Valid encounter within last 6 months    Recent Outpatient Visits           2 months ago Annual physical exam   [facility] at [facility], Leita DEL, MD       Future Appointments             In 4 months Justus, Leita DEL, MD [facility] Primary Care & Sports Medicine at [facility], [facility]

## 2024-05-16 ENCOUNTER — Other Ambulatory Visit: Payer: Self-pay | Admitting: Internal Medicine

## 2024-05-16 DIAGNOSIS — R053 Chronic cough: Secondary | ICD-10-CM

## 2024-05-16 DIAGNOSIS — E782 Mixed hyperlipidemia: Secondary | ICD-10-CM

## 2024-05-16 DIAGNOSIS — I10 Essential (primary) hypertension: Secondary | ICD-10-CM

## 2024-05-17 NOTE — Telephone Encounter (Signed)
 Requested Prescriptions  Pending Prescriptions Disp Refills   rosuvastatin  (CRESTOR ) 10 MG tablet [Pharmacy Med Name: ROSUVASTATIN  CALCIUM  10 MG TAB] 90 tablet 2    Sig: TAKE 1 TABLET BY MOUTH EVERY DAY     Cardiovascular:  Antilipid - Statins 2 Failed - 05/17/2024 11:11 PM      Failed - Lipid Panel in normal range within the last 12 months    Cholesterol, Total  Date Value Ref Range Status  02/19/2024 243 (H) 100 - 199 mg/dL Final   LDL Chol Calc (NIH)  Date Value Ref Range Status  02/19/2024 165 (H) 0 - 99 mg/dL Final   HDL  Date Value Ref Range Status  02/19/2024 47 >39 mg/dL Final   Triglycerides  Date Value Ref Range Status  02/19/2024 170 (H) 0 - 149 mg/dL Final         Passed - Cr in normal range and within 360 days    Creatinine, Ser  Date Value Ref Range Status  02/19/2024 0.65 0.57 - 1.00 mg/dL Final         Passed - Patient is not pregnant      Passed - Valid encounter within last 12 months    Recent Outpatient Visits           2 months ago Annual physical exam   [place] [facility] at [facility], Leita DEL, MD       Future Appointments             In 3 months Justus Leita DEL, MD [facility] Primary Care & Sports Medicine at [facility], [facility]             montelukast  (SINGULAIR ) 10 MG tablet [Pharmacy Med Name: MONTELUKAST  SOD 10 MG TABLET] 90 tablet 0    Sig: TAKE 1 TABLET BY MOUTH EVERYDAY AT BEDTIME     Pulmonology:  Leukotriene Inhibitors Passed - 05/17/2024 11:11 PM      Passed - Valid encounter within last 12 months    Recent Outpatient Visits           2 months ago Annual physical exam   [place] [facility] at [facility], Leita DEL, MD       Future Appointments             In 3 months Justus Leita DEL, MD [facility] Primary Care & Sports Medicine at [facility], [facility]             lisinopril  (ZESTRIL ) 20 MG tablet [Pharmacy Med Name: LISINOPRIL  20  MG TABLET] 90 tablet 0    Sig: TAKE 1 TABLET BY MOUTH EVERY DAY     Cardiovascular:  ACE Inhibitors Passed - 05/17/2024 11:11 PM      Passed - Cr in normal range and within 180 days    Creatinine, Ser  Date Value Ref Range Status  02/19/2024 0.65 0.57 - 1.00 mg/dL Final         Passed - K in normal range and within 180 days    Potassium  Date Value Ref Range Status  02/19/2024 4.6 3.5 - 5.2 mmol/L Final         Passed - Patient is not pregnant      Passed - Last BP in normal range    BP Readings from Last 1 Encounters:  02/19/24 126/78         Passed - Valid encounter within last 6 months  Recent Outpatient Visits           2 months ago Annual physical exam   [facility] Care [facility] Medicine at [facility], Leita DEL, MD       Future Appointments             In 3 months Justus, Leita DEL, MD [facility] Primary Care & Sports Medicine at [facility], [facility]

## 2024-06-12 DIAGNOSIS — L578 Other skin changes due to chronic exposure to nonionizing radiation: Secondary | ICD-10-CM | POA: Diagnosis not present

## 2024-06-12 DIAGNOSIS — Z85828 Personal history of other malignant neoplasm of skin: Secondary | ICD-10-CM | POA: Diagnosis not present

## 2024-06-12 DIAGNOSIS — Z86018 Personal history of other benign neoplasm: Secondary | ICD-10-CM | POA: Diagnosis not present

## 2024-06-12 DIAGNOSIS — L718 Other rosacea: Secondary | ICD-10-CM | POA: Diagnosis not present

## 2024-06-12 DIAGNOSIS — Z872 Personal history of diseases of the skin and subcutaneous tissue: Secondary | ICD-10-CM | POA: Diagnosis not present

## 2024-06-12 DIAGNOSIS — L255 Unspecified contact dermatitis due to plants, except food: Secondary | ICD-10-CM | POA: Diagnosis not present

## 2024-07-27 ENCOUNTER — Other Ambulatory Visit: Payer: Self-pay | Admitting: Internal Medicine

## 2024-07-27 DIAGNOSIS — R058 Other specified cough: Secondary | ICD-10-CM

## 2024-07-29 NOTE — Telephone Encounter (Signed)
 Requested Prescriptions  Pending Prescriptions Disp Refills   fluticasone  (FLONASE ) 50 MCG/ACT nasal spray [Pharmacy Med Name: FLUTICASONE  PROP 50 MCG SPRAY] 48 mL 0    Sig: SPRAY 2 SPRAYS INTO EACH NOSTRIL EVERY DAY     Ear, Nose, and Throat: Nasal Preparations - Corticosteroids Passed - 07/29/2024  4:23 PM      Passed - Valid encounter within last 12 months    Recent Outpatient Visits           5 months ago Annual physical exam   [facility] Primary Care & [facility] Medicine at [facility], Leita DEL, MD       Future Appointments             In 3 weeks Justus Leita DEL, MD [facility] Primary Care & Sports Medicine at [facility], [phone] [place]

## 2024-08-21 ENCOUNTER — Ambulatory Visit: Admitting: Internal Medicine

## 2024-08-24 ENCOUNTER — Other Ambulatory Visit: Payer: Self-pay | Admitting: Internal Medicine

## 2024-08-24 DIAGNOSIS — R053 Chronic cough: Secondary | ICD-10-CM

## 2024-08-24 DIAGNOSIS — I10 Essential (primary) hypertension: Secondary | ICD-10-CM

## 2024-08-27 NOTE — Telephone Encounter (Signed)
 Requested Prescriptions  Pending Prescriptions Disp Refills   lisinopril  (ZESTRIL ) 20 MG tablet [Pharmacy Med Name: LISINOPRIL  20 MG TABLET] 90 tablet 0    Sig: TAKE 1 TABLET BY MOUTH EVERY DAY     Cardiovascular:  ACE Inhibitors Failed - 08/27/2024  9:15 AM      Failed - Cr in normal range and within 180 days    Creatinine, Ser  Date Value Ref Range Status  02/19/2024 0.65 0.57 - 1.00 mg/dL Final         Failed - K in normal range and within 180 days    Potassium  Date Value Ref Range Status  02/19/2024 4.6 3.5 - 5.2 mmol/L Final         Passed - Patient is not pregnant      Passed - Last BP in normal range    BP Readings from Last 1 Encounters:  02/19/24 126/78         Passed - Valid encounter within last 6 months    Recent Outpatient Visits           6 months ago Annual physical exam   [facility] at [facility], Leita DEL, MD               montelukast  (SINGULAIR ) 10 MG tablet [Pharmacy Med Name: MONTELUKAST  SOD 10 MG TABLET] 90 tablet 0    Sig: TAKE 1 TABLET BY MOUTH EVERYDAY AT BEDTIME     Pulmonology:  Leukotriene Inhibitors Passed - 08/27/2024  9:15 AM      Passed - Valid encounter within last 12 months    Recent Outpatient Visits           6 months ago Annual physical exam   [facility] Primary Care & Sports Medicine at [facility], Leita DEL, MD

## 2024-09-26 ENCOUNTER — Other Ambulatory Visit (INDEPENDENT_AMBULATORY_CARE_PROVIDER_SITE_OTHER): Payer: Self-pay | Admitting: Vascular Surgery

## 2024-09-26 DIAGNOSIS — I6523 Occlusion and stenosis of bilateral carotid arteries: Secondary | ICD-10-CM

## 2024-09-27 ENCOUNTER — Encounter (INDEPENDENT_AMBULATORY_CARE_PROVIDER_SITE_OTHER): Payer: Medicare HMO

## 2024-09-27 ENCOUNTER — Ambulatory Visit (INDEPENDENT_AMBULATORY_CARE_PROVIDER_SITE_OTHER): Payer: Medicare HMO | Admitting: Vascular Surgery

## 2024-11-08 ENCOUNTER — Ambulatory Visit (INDEPENDENT_AMBULATORY_CARE_PROVIDER_SITE_OTHER)

## 2024-11-08 ENCOUNTER — Ambulatory Visit (INDEPENDENT_AMBULATORY_CARE_PROVIDER_SITE_OTHER): Admitting: Vascular Surgery

## 2024-11-08 DIAGNOSIS — I6523 Occlusion and stenosis of bilateral carotid arteries: Secondary | ICD-10-CM | POA: Diagnosis not present

## 2024-11-26 ENCOUNTER — Ambulatory Visit (INDEPENDENT_AMBULATORY_CARE_PROVIDER_SITE_OTHER): Admitting: Vascular Surgery

## 2025-03-20 ENCOUNTER — Ambulatory Visit
